# Patient Record
Sex: Female | Born: 2000 | Hispanic: Yes | Marital: Single | Smoking: Never smoker
Health system: Southern US, Community
[De-identification: ages and names within clinical notes are randomized; demographics above are authoritative.]

## PROBLEM LIST (undated history)

## (undated) DIAGNOSIS — O00101 Right tubal pregnancy without intrauterine pregnancy: Secondary | ICD-10-CM

## (undated) DIAGNOSIS — Z789 Other specified health status: Secondary | ICD-10-CM

## (undated) HISTORY — DX: Right tubal pregnancy without intrauterine pregnancy: O00.101

## (undated) HISTORY — PX: NO PAST SURGERIES: SHX2092

---

## 2020-07-07 ENCOUNTER — Encounter (HOSPITAL_COMMUNITY): Payer: Self-pay

## 2020-07-07 ENCOUNTER — Other Ambulatory Visit: Payer: Self-pay

## 2020-07-07 ENCOUNTER — Ambulatory Visit (HOSPITAL_COMMUNITY)
Admission: EM | Admit: 2020-07-07 | Discharge: 2020-07-07 | Disposition: A | Discharge status: Home / Self Care | Payer: 59 | Attending: Family Medicine | Admitting: Family Medicine

## 2020-07-07 DIAGNOSIS — N3091 Cystitis, unspecified with hematuria: Secondary | ICD-10-CM | POA: Diagnosis present

## 2020-07-07 DIAGNOSIS — R300 Dysuria: Secondary | ICD-10-CM | POA: Diagnosis present

## 2020-07-07 DIAGNOSIS — R1084 Generalized abdominal pain: Secondary | ICD-10-CM | POA: Diagnosis present

## 2020-07-07 DIAGNOSIS — Z3202 Encounter for pregnancy test, result negative: Secondary | ICD-10-CM | POA: Diagnosis not present

## 2020-07-07 LAB — POCT URINALYSIS DIP (DEVICE)
Bilirubin Urine: NEGATIVE
Glucose, UA: NEGATIVE mg/dL
Ketones, ur: NEGATIVE mg/dL
Nitrite: POSITIVE — AB
Protein, ur: NEGATIVE mg/dL
Specific Gravity, Urine: 1.025 (ref 1.005–1.030)
Urobilinogen, UA: 0.2 mg/dL (ref 0.0–1.0)
pH: 6.5 (ref 5.0–8.0)

## 2020-07-07 LAB — POC URINE PREG, ED: Preg Test, Ur: NEGATIVE

## 2020-07-07 MED ORDER — SULFAMETHOXAZOLE-TRIMETHOPRIM 800-160 MG PO TABS
1.0000 | ORAL_TABLET | Freq: Two times a day (BID) | ORAL | 0 refills | Status: DC
Start: 2020-07-07 — End: 2021-02-05

## 2020-07-07 NOTE — ED Triage Notes (Signed)
Stratus video translator used ID [id number].    Pt reports abdominal pain, vaginal pain, and light yellow urine that has a bad odor starting 3 days ago and dysuria.  Pt taking medicine that the pharmacy gave her, unsure of name.  Pt denies vaginal discharge, itching or rash.  Pt reports hematuria when symptoms started.  Pt also reports unprotected sex and requests STD testing.

## 2020-07-07 NOTE — ED Provider Notes (Addendum)
[facility]   MRN: [id number] DOB: May 21, 2001  Subjective:   Meredith Sullivan is a 19 y.o. female presenting for 3-day history of persistent worsening dysuria, urinary frequency, abdominal pain, low back pain, dark-colored urine that is malodorous.  Patient states that she took over-the-counter medication as recommended by the pharmacy with no improvement.  She has also had unprotected sex and would like STI testing.  Denies vaginal discharge, genital rash.  She is not currently taking any medications and has no known food or drug allergies.  Denies past medical and surgical history.   Family History  Problem Relation Age of Onset   Healthy Mother    Healthy Father     Social History   Tobacco Use   Smoking status: Never Smoker   Smokeless tobacco: Never Used  Building services engineer Use: Some days   Substances: Flavoring  Substance Use Topics   Alcohol use: Never   Drug use: Not Currently    ROS   Objective:   Vitals: BP 105/65 (BP Location: Left Arm)    Pulse 68    Temp 98.9 F (37.2 C) (Oral)    Resp 18    LMP 06/23/2020 (Exact Date)    SpO2 100%   Physical Exam Constitutional:      General: She is not in acute distress.    Appearance: Normal appearance. She is well-developed. She is not ill-appearing, toxic-appearing or diaphoretic.  HENT:     Head: Normocephalic and atraumatic.     Nose: Nose normal.     Mouth/Throat:     Mouth: Mucous membranes are moist.     Pharynx: Oropharynx is clear.  Eyes:     General: No scleral icterus.    Extraocular Movements: Extraocular movements intact.     Pupils: Pupils are equal, round, and reactive to light.  Cardiovascular:     Rate and Rhythm: Normal rate.  Pulmonary:     Effort: Pulmonary effort is normal.  Abdominal:     General: Bowel sounds are normal. There is no distension.     Palpations: Abdomen is soft. There is no mass.     Tenderness: There is abdominal tenderness in the right upper  quadrant, right lower quadrant, suprapubic area and left lower quadrant. There is no right CVA tenderness, left CVA tenderness, guarding or rebound.  Skin:    General: Skin is warm and dry.  Neurological:     General: No focal deficit present.     Mental Status: She is alert and oriented to person, place, and time.  Psychiatric:        Mood and Affect: Mood normal.        Behavior: Behavior normal.        Thought Content: Thought content normal.        Judgment: Judgment normal.     Results for orders placed or performed during the hospital encounter of 07/07/20 (from the past 24 hour(s))  POCT urinalysis dip (device)     Status: Abnormal   Collection Time: 07/07/20  2:38 PM  Result Value Ref Range   Glucose, UA NEGATIVE NEGATIVE mg/dL   Bilirubin Urine NEGATIVE NEGATIVE   Ketones, ur NEGATIVE NEGATIVE mg/dL   Specific Gravity, Urine 1.025 1.005 - 1.030   Hgb urine dipstick SMALL (A) NEGATIVE   pH 6.5 5.0 - 8.0   Protein, ur NEGATIVE NEGATIVE mg/dL   Urobilinogen, UA 0.2 0.0 - 1.0 mg/dL   Nitrite POSITIVE (A) NEGATIVE   Leukocytes,Ua SMALL (  A) NEGATIVE  POC urine pregnancy     Status: None   Collection Time: 07/07/20  2:45 PM  Result Value Ref Range   Preg Test, Ur NEGATIVE NEGATIVE    Assessment and Plan :   PDMP not reviewed this encounter.  1. Generalized abdominal pain   2. Dysuria   3. Hemorrhagic cystitis     Negative pregnancy test, low suspicion for pyelonephritis.  Patient is to start Bactrim to cover for hemorrhagic cystitis, hydrate aggressively.  STI testing, urine culture pending. Counseled patient on potential for adverse effects with medications prescribed/recommended today, ER and return-to-clinic precautions discussed, patient verbalized understanding.    Wallis Bamberg, PA-C 07/07/20 1505

## 2020-07-08 LAB — CERVICOVAGINAL ANCILLARY ONLY
Bacterial Vaginitis (gardnerella): NEGATIVE
Candida Glabrata: NEGATIVE
Candida Vaginitis: NEGATIVE
Chlamydia: NEGATIVE
Comment: NEGATIVE
Comment: NEGATIVE
Comment: NEGATIVE
Comment: NEGATIVE
Comment: NEGATIVE
Comment: NORMAL
Neisseria Gonorrhea: NEGATIVE
Trichomonas: NEGATIVE

## 2020-07-09 LAB — URINE CULTURE: Culture: 20000 — AB

## 2021-02-05 ENCOUNTER — Other Ambulatory Visit: Payer: Self-pay

## 2021-02-05 ENCOUNTER — Emergency Department (HOSPITAL_COMMUNITY): Payer: Self-pay

## 2021-02-05 ENCOUNTER — Emergency Department (HOSPITAL_COMMUNITY)
Admission: EM | Admit: 2021-02-05 | Discharge: 2021-02-05 | Disposition: A | Discharge status: Home / Self Care | Payer: Self-pay | Attending: Emergency Medicine | Admitting: Emergency Medicine

## 2021-02-05 ENCOUNTER — Encounter (HOSPITAL_COMMUNITY): Payer: Self-pay | Admitting: Emergency Medicine

## 2021-02-05 DIAGNOSIS — M25551 Pain in right hip: Secondary | ICD-10-CM | POA: Insufficient documentation

## 2021-02-05 DIAGNOSIS — M25559 Pain in unspecified hip: Secondary | ICD-10-CM

## 2021-02-05 DIAGNOSIS — R519 Headache, unspecified: Secondary | ICD-10-CM | POA: Insufficient documentation

## 2021-02-05 DIAGNOSIS — H538 Other visual disturbances: Secondary | ICD-10-CM | POA: Insufficient documentation

## 2021-02-05 DIAGNOSIS — M25552 Pain in left hip: Secondary | ICD-10-CM | POA: Insufficient documentation

## 2021-02-05 LAB — BASIC METABOLIC PANEL
Anion gap: 9 (ref 5–15)
BUN: 5 mg/dL — ABNORMAL LOW (ref 6–20)
CO2: 22 mmol/L (ref 22–32)
Calcium: 9.3 mg/dL (ref 8.9–10.3)
Chloride: 108 mmol/L (ref 98–111)
Creatinine, Ser: 0.56 mg/dL (ref 0.44–1.00)
GFR, Estimated: >60 mL/min (ref >60)
Glucose, Bld: 91 mg/dL (ref 70–99)
Potassium: 3.6 mmol/L (ref 3.5–5.1)
Sodium: 139 mmol/L (ref 135–145)

## 2021-02-05 LAB — CBC WITH DIFFERENTIAL/PLATELET
Abs Immature Granulocytes: 0.01 10*3/uL (ref 0.00–0.07)
Basophils Absolute: 0 10*3/uL (ref 0.0–0.1)
Basophils Relative: 0 %
Eosinophils Absolute: 0.2 10*3/uL (ref 0.0–0.5)
Eosinophils Relative: 3 %
HCT: 42.4 % (ref 36.0–46.0)
Hemoglobin: 13.7 g/dL (ref 12.0–15.0)
Immature Granulocytes: 0 %
Lymphocytes Relative: 48 %
Lymphs Abs: 2.9 10*3/uL (ref 0.7–4.0)
MCH: 29.7 pg (ref 26.0–34.0)
MCHC: 32.3 g/dL (ref 30.0–36.0)
MCV: 91.8 fL (ref 80.0–100.0)
Monocytes Absolute: 0.6 10*3/uL (ref 0.1–1.0)
Monocytes Relative: 10 %
Neutro Abs: 2.4 10*3/uL (ref 1.7–7.7)
Neutrophils Relative %: 39 %
Platelets: 216 10*3/uL (ref 150–400)
RBC: 4.62 MIL/uL (ref 3.87–5.11)
RDW: 14.6 % (ref 11.5–15.5)
WBC: 6.2 10*3/uL (ref 4.0–10.5)
nRBC: 0 % (ref 0.0–0.2)

## 2021-02-05 LAB — URINALYSIS, ROUTINE W REFLEX MICROSCOPIC
Bilirubin Urine: NEGATIVE
Glucose, UA: NEGATIVE mg/dL
Hgb urine dipstick: NEGATIVE
Ketones, ur: NEGATIVE mg/dL
Leukocytes,Ua: NEGATIVE
Nitrite: NEGATIVE
Protein, ur: NEGATIVE mg/dL
Specific Gravity, Urine: 1.003 — ABNORMAL LOW (ref 1.005–1.030)
pH: 8 (ref 5.0–8.0)

## 2021-02-05 LAB — POC URINE PREG, ED: Preg Test, Ur: NEGATIVE

## 2021-02-05 MED ORDER — SODIUM CHLORIDE 0.9 % IV BOLUS
1000.0000 mL | Freq: Once | INTRAVENOUS | Status: AC
  Administered 2021-02-05: 1000 mL via INTRAVENOUS

## 2021-02-05 NOTE — ED Provider Notes (Signed)
[facility] EMERGENCY DEPARTMENT Provider Note   CSN: [id number] Arrival date & time: 02/05/21  1417     History Chief Complaint  Patient presents with  . Blurred Vision  . Back Pain    Meredith Sullivan is a 20 y.o. female no significant history who presents to the ED for multiple complaints. Over the last month since hitting her head, she reports intermittent headache and blurry vision. Initially began having blurry vision over 1 year ago at which time she was seen by Ophtho and prescribed corrective lenses which helped. Since hitting her head, she reports intermittent episodes of blurry vision that come on spontaneously without known precipitating events and then resolved on its own. Also reports intermittent pressure-like generalized headaches. No thunderclap component, no headache upon awakening in the AM. Patient states she notices them usually during the week days and states being under considerable stress recently. No previous history of headaches. In additional, patient also reports chronic BLT hip pain over the last 8 months which was worse today. No previous injury to hips. Denies fever, chills, neck pain, hearing loss, tinnitus, chest pain, SOB, N/V/D, photophobia, abdominal pain, or urinary symptoms. LMP 2 months ago; states to have irregular periods at baseline. In ED, patient denies headache or vision changes.  The history is provided by the patient and medical records. The history is limited by a language barrier. A language interpreter was used.  Headache Pain location:  Generalized Quality: pressure. Radiates to:  Does not radiate Onset quality:  Gradual Duration:  4 weeks Timing:  Intermittent Progression:  Resolved Chronicity:  New Similar to prior headaches: no   Context comment:  Struck her head 4 weeks ago Relieved by:  Nothing Worsened by:  Nothing Ineffective treatments:  None tried Associated symptoms: blurred vision   Associated  symptoms: no abdominal pain, no back pain, no cough, no dizziness, no ear pain, no eye pain, no fever, no focal weakness, no hearing loss, no loss of balance, no nausea, no near-syncope, no neck pain, no neck stiffness, no numbness, no seizures, no sore throat, no syncope and no vomiting        History reviewed. No pertinent past medical history.  There are no problems to display for this patient.   History reviewed. No pertinent surgical history.   OB History   No obstetric history on file.     Family History  Problem Relation Age of Onset  . Healthy Mother   . Healthy Father     Social History   Tobacco Use  . Smoking status: Never Smoker  . Smokeless tobacco: Never Used  Vaping Use  . Vaping Use: Some days  . Substances: Flavoring  Substance Use Topics  . Alcohol use: Never  . Drug use: Not Currently    Home Medications Prior to Admission medications   Medication Sig Start Date End Date Taking? Authorizing Provider  acetaminophen (TYLENOL) 500 MG tablet Take 1,000 mg by mouth 2 (two) times daily as needed for headache (pain).   Yes [provider]  Ascorbic Acid (VITAMIN C PO) Take 1 tablet by mouth 2 (two) times daily.   Yes [provider]  levonorgestrel (PLAN B 1-STEP) 1.5 MG tablet Take 1.5 mg by mouth once as needed (following sexual intercourse).   Yes [provider]    Allergies    Patient has no known allergies.  Review of Systems   Review of Systems  Constitutional: Negative for chills and fever.  HENT: Negative  for ear pain, hearing loss and sore throat.   Eyes: Positive for blurred vision and visual disturbance. Negative for pain.  Respiratory: Negative for cough and shortness of breath.   Cardiovascular: Negative for chest pain, palpitations, syncope and near-syncope.  Gastrointestinal: Negative for abdominal pain, nausea and vomiting.  Genitourinary: Negative for dysuria and hematuria.  Musculoskeletal: Negative for  arthralgias, back pain, neck pain and neck stiffness.  Skin: Negative for color change and rash.  Neurological: Positive for headaches. Negative for dizziness, focal weakness, seizures, syncope, numbness and loss of balance.  All other systems reviewed and are negative.   Physical Exam Updated Vital Signs BP 107/67   Pulse 70   Temp 98.4 F (36.9 C) (Oral)   Resp (!) 21   SpO2 100%   Physical Exam Vitals and nursing note reviewed.  Constitutional:      General: She is awake. She is not in acute distress.    Appearance: Normal appearance. She is well-developed, well-groomed, overweight and well-nourished. She is not ill-appearing.  HENT:     Head: Normocephalic and atraumatic.     Right Ear: External ear normal.     Left Ear: External ear normal.     Nose: Nose normal.     Mouth/Throat:     Mouth: Mucous membranes are moist.     Pharynx: Oropharynx is clear. No oropharyngeal exudate or posterior oropharyngeal erythema.  Eyes:     General: No visual field deficit or scleral icterus.       Right eye: No discharge.        Left eye: No discharge.     Extraocular Movements: Extraocular movements intact.     Conjunctiva/sclera: Conjunctivae normal.     Pupils: Pupils are equal, round, and reactive to light.  Cardiovascular:     Rate and Rhythm: Normal rate and regular rhythm.     Pulses: Normal pulses.     Heart sounds: Normal heart sounds. No murmur heard.   Pulmonary:     Effort: Pulmonary effort is normal. No respiratory distress.     Breath sounds: Normal breath sounds.  Abdominal:     General: Abdomen is flat. There is no distension.     Palpations: Abdomen is soft.     Tenderness: There is no abdominal tenderness. There is no guarding or rebound.  Musculoskeletal:        General: No edema.     Cervical back: Neck supple. No pain with movement, spinous process tenderness or muscular tenderness. Normal range of motion.     Comments: No tenderness or overlying skin  changes to BLT hips. ROM preserved and NVI distally in BLE.  Skin:    General: Skin is warm and dry.     Findings: No rash.  Neurological:     General: No focal deficit present.     Mental Status: She is alert and oriented to person, place, and time.     GCS: GCS eye subscore is 4. GCS verbal subscore is 5. GCS motor subscore is 6.     Cranial Nerves: Cranial nerves are intact. No cranial nerve deficit, dysarthria or facial asymmetry.     Sensory: Sensation is intact. No sensory deficit.     Motor: Motor function is intact. No weakness or pronator drift.     Coordination: Coordination is intact. Finger-Nose-Finger Test and Heel to [place] Test normal.     Gait: Gait is intact.  Psychiatric:        Mood and Affect: Mood  and affect and mood normal.        Behavior: Behavior normal. Behavior is cooperative.     ED Results / Procedures / Treatments   Labs (all labs ordered are listed, but only abnormal results are displayed) Labs Reviewed  BASIC METABOLIC PANEL - Abnormal; Notable for the following components:      Result Value   BUN 5 (*)    All other components within normal limits  CBC WITH DIFFERENTIAL/PLATELET  URINALYSIS, ROUTINE W REFLEX MICROSCOPIC  POC URINE PREG, ED    EKG None  Radiology CT Head Wo Contrast  Result Date: 02/05/2021 CLINICAL DATA:  Headache.  Pt reports lower back pain x 8 months. EXAM: CT HEAD WITHOUT CONTRAST TECHNIQUE: Contiguous axial images were obtained from the base of the skull through the vertex without intravenous contrast. COMPARISON:  None. FINDINGS: Brain: No evidence of large-territorial acute infarction. No parenchymal hemorrhage. No mass lesion. No extra-axial collection. No mass effect or midline shift. No hydrocephalus. Basilar cisterns are patent. Vascular: No hyperdense vessel. Skull: No acute fracture or focal lesion. Sinuses/Orbits: Paranasal sinuses and mastoid air cells are clear. The orbits are unremarkable. Other: None. IMPRESSION:  No acute intracranial abnormality. Electronically Signed   By: Tish FredericksonMorgane  Naveau M.D.   On: 02/05/2021 17:14   DG Pelvis Portable  Result Date: 02/05/2021 CLINICAL DATA:  Bilateral hip pain. EXAM: PORTABLE PELVIS 1-2 VIEWS COMPARISON:  None. FINDINGS: There is no evidence of pelvic fracture or diastasis. No pelvic bone lesions are seen. IMPRESSION: Negative. Electronically Signed   By: Tish FredericksonMorgane  Naveau M.D.   On: 02/05/2021 16:21    Procedures Procedures  Medications Ordered in ED Medications  sodium chloride 0.9 % bolus 1,000 mL (1,000 mLs Intravenous New Bag/Given 02/05/21 1549)    ED Course  I have reviewed the triage vital signs and the nursing notes.  Pertinent labs & imaging results that were available during my care of the patient were reviewed by me and considered in my medical decision making (see chart for details).    MDM Rules/Calculators/A&P                          Patient is a well-appearing 19yoF with history and physical as described above who presents to the ED for intermittent headache, blurry vision, and BLT hip pain. VS reassuring and HDS. Patient resting comfortably and no acute distress. Not experiencing blurry vision or headache at this time, but still having hip pain. Initial workup includes head CT, hip plain films, and blood work.  CT head and hip plain films unremarkable. Preg negative. Blood work reassuring. On reassessment, patient resting comfortably. No headache or blurred vision during ED course. Doubt ICH, space occupying lesion, CVA/TIA, hydrocephalus, carotid dissection, meningitis, preeclampsia, cerebral sinus thrombosis, vertebral artery insufficiency, or other acute emergent pathology at this time. Etiology for headache and blurry vision remain unclear at this time but could be related to atypical migraine, tension HA, or idiopathic intracranial hypertension. Recommend she follow up with her Ophthalmologist if she continues to have blurry vision. Regarding  hip pain, doubt septic arthritis, fracture, dislocation, avascular necrosis, or other acute pathology at this time. Suspect presentation likely musculoskeletal. Recommend Tylenol and NSAIDs for pain. Will provide number for her to follow up with new PCP if she continues to have symptoms. Patient otherwise HDS and appropriate for discharge.  Strict return precautions provided and discussed. Questions and concerns addressed. Patient verbalized understanding and amenable with discharge plan.  Discharged in stable condition.   Final Clinical Impression(s) / ED Diagnoses Final diagnoses:  Hip pain  Nonintractable headache, unspecified chronicity pattern, unspecified headache type  Blurry vision    Rx / DC Orders ED Discharge Orders    None       Tonia Brooms, MD 02/06/21 9191    Jacalyn Lefevre, MD 02/06/21 [phone]

## 2021-02-05 NOTE — ED Triage Notes (Signed)
Pt reports lower back pain x 8 months.  Denies urinary complaints.  Reports blurred vision x 5 months with headaches.  States she had blurred vision 1 year ago and got glasses and it was better for awhile and then returned.  LMP 2 months ago- states she is sexually active but takes Plan B after having intercourse so she doesn't believe she is pregnant.  Str[employer] interpreter used.

## 2022-03-20 ENCOUNTER — Ambulatory Visit (HOSPITAL_COMMUNITY)
Admission: AD | Admit: 2022-03-20 | Discharge: 2022-03-20 | Disposition: A | Discharge status: Home / Self Care | Payer: Self-pay | Attending: Obstetrics & Gynecology | Admitting: Obstetrics & Gynecology

## 2022-03-20 ENCOUNTER — Inpatient Hospital Stay (HOSPITAL_BASED_OUTPATIENT_CLINIC_OR_DEPARTMENT_OTHER): Payer: Self-pay | Admitting: Certified Registered"

## 2022-03-20 ENCOUNTER — Inpatient Hospital Stay (HOSPITAL_COMMUNITY): Payer: Self-pay | Admitting: Certified Registered"

## 2022-03-20 ENCOUNTER — Encounter (HOSPITAL_COMMUNITY): Payer: Self-pay

## 2022-03-20 ENCOUNTER — Encounter (HOSPITAL_COMMUNITY): Payer: Self-pay | Admitting: Obstetrics & Gynecology

## 2022-03-20 ENCOUNTER — Encounter (HOSPITAL_COMMUNITY)
Admission: AD | Disposition: A | Discharge status: Home / Self Care | Payer: Self-pay | Source: Home / Self Care | Attending: Obstetrics & Gynecology

## 2022-03-20 ENCOUNTER — Inpatient Hospital Stay (HOSPITAL_COMMUNITY): Payer: Self-pay

## 2022-03-20 ENCOUNTER — Other Ambulatory Visit: Payer: Self-pay

## 2022-03-20 ENCOUNTER — Ambulatory Visit (HOSPITAL_COMMUNITY)
Admission: EM | Admit: 2022-03-20 | Discharge: 2022-03-20 | Disposition: A | Discharge status: Home / Self Care | Payer: Self-pay | Attending: Physician Assistant | Admitting: Physician Assistant

## 2022-03-20 DIAGNOSIS — O00109 Unspecified tubal pregnancy without intrauterine pregnancy: Secondary | ICD-10-CM

## 2022-03-20 DIAGNOSIS — Z3A01 Less than 8 weeks gestation of pregnancy: Secondary | ICD-10-CM

## 2022-03-20 DIAGNOSIS — Z3A00 Weeks of gestation of pregnancy not specified: Secondary | ICD-10-CM

## 2022-03-20 DIAGNOSIS — O00101 Right tubal pregnancy without intrauterine pregnancy: Secondary | ICD-10-CM

## 2022-03-20 DIAGNOSIS — O4690 Antepartum hemorrhage, unspecified, unspecified trimester: Secondary | ICD-10-CM

## 2022-03-20 DIAGNOSIS — Z3201 Encounter for pregnancy test, result positive: Secondary | ICD-10-CM

## 2022-03-20 HISTORY — DX: Other specified health status: Z78.9

## 2022-03-20 HISTORY — PX: LAPAROSCOPIC UNILATERAL SALPINGECTOMY: SHX5934

## 2022-03-20 LAB — CBC
HCT: 39.6 % (ref 36.0–46.0)
Hemoglobin: 13.6 g/dL (ref 12.0–15.0)
MCH: 30.4 pg (ref 26.0–34.0)
MCHC: 34.3 g/dL (ref 30.0–36.0)
MCV: 88.6 fL (ref 80.0–100.0)
Platelets: 231 10*3/uL (ref 150–400)
RBC: 4.47 MIL/uL (ref 3.87–5.11)
RDW: 14.5 % (ref 11.5–15.5)
WBC: 10.6 10*3/uL — ABNORMAL HIGH (ref 4.0–10.5)
nRBC: 0 % (ref 0.0–0.2)

## 2022-03-20 LAB — WET PREP, GENITAL
Clue Cells Wet Prep HPF POC: NONE SEEN
Sperm: NONE SEEN
Trich, Wet Prep: NONE SEEN
WBC, Wet Prep HPF POC: >=10 — AB (ref <10)
Yeast Wet Prep HPF POC: NONE SEEN

## 2022-03-20 LAB — COMPREHENSIVE METABOLIC PANEL
ALT: 22 U/L (ref 0–44)
AST: 19 U/L (ref 15–41)
Albumin: 4.3 g/dL (ref 3.5–5.0)
Alkaline Phosphatase: 58 U/L (ref 38–126)
Anion gap: 7 (ref 5–15)
BUN: 8 mg/dL (ref 6–20)
CO2: 24 mmol/L (ref 22–32)
Calcium: 9.3 mg/dL (ref 8.9–10.3)
Chloride: 108 mmol/L (ref 98–111)
Creatinine, Ser: 0.66 mg/dL (ref 0.44–1.00)
GFR, Estimated: >60 mL/min (ref >60)
Glucose, Bld: 102 mg/dL — ABNORMAL HIGH (ref 70–99)
Potassium: 3.3 mmol/L — ABNORMAL LOW (ref 3.5–5.1)
Sodium: 139 mmol/L (ref 135–145)
Total Bilirubin: 0.6 mg/dL (ref 0.3–1.2)
Total Protein: 7.2 g/dL (ref 6.5–8.1)

## 2022-03-20 LAB — ABO/RH: ABO/RH(D): O POS

## 2022-03-20 LAB — POC URINE PREG, ED: Preg Test, Ur: POSITIVE — AB

## 2022-03-20 LAB — HCG, QUANTITATIVE, PREGNANCY: hCG, Beta Chain, Quant, S: 6452 m[IU]/mL — ABNORMAL HIGH (ref <5)

## 2022-03-20 SURGERY — SALPINGECTOMY, UNILATERAL, LAPAROSCOPIC
Anesthesia: General | Laterality: Right

## 2022-03-20 MED ORDER — BUPIVACAINE HCL (PF) 0.25 % IJ SOLN
INTRAMUSCULAR | Status: AC
Start: 2022-03-20 — End: ?
  Filled 2022-03-20: qty 30

## 2022-03-20 MED ORDER — POVIDONE-IODINE 10 % EX SWAB
2.0000 "application " | Freq: Once | CUTANEOUS | Status: AC
  Administered 2022-03-20: 2 via TOPICAL

## 2022-03-20 MED ORDER — HYDROMORPHONE HCL 1 MG/ML IJ SOLN: 0.2500 mg | INTRAMUSCULAR | Status: DC | PRN

## 2022-03-20 MED ORDER — ARTIFICIAL TEARS OPHTHALMIC OINT
TOPICAL_OINTMENT | OPHTHALMIC | Status: AC
  Filled 2022-03-20: qty 3.5

## 2022-03-20 MED ORDER — MIDAZOLAM HCL 2 MG/2ML IJ SOLN
INTRAMUSCULAR | Status: AC
Start: 2022-03-20 — End: ?
  Filled 2022-03-20: qty 2

## 2022-03-20 MED ORDER — DEXAMETHASONE SODIUM PHOSPHATE 10 MG/ML IJ SOLN
INTRAMUSCULAR | Status: AC
  Filled 2022-03-20: qty 1

## 2022-03-20 MED ORDER — SUGAMMADEX SODIUM 200 MG/2ML IV SOLN
INTRAVENOUS | Status: DC | PRN
  Administered 2022-03-20: 300 mg via INTRAVENOUS

## 2022-03-20 MED ORDER — SODIUM CHLORIDE 0.9 % IR SOLN
Status: DC | PRN
  Administered 2022-03-20: 1000 mL

## 2022-03-20 MED ORDER — ROCURONIUM BROMIDE 10 MG/ML (PF) SYRINGE
PREFILLED_SYRINGE | INTRAVENOUS | Status: AC
  Filled 2022-03-20: qty 10

## 2022-03-20 MED ORDER — CHLORHEXIDINE GLUCONATE 0.12 % MT SOLN
OROMUCOSAL | Status: AC
  Administered 2022-03-20: 15 mL via OROMUCOSAL
  Filled 2022-03-20: qty 15

## 2022-03-20 MED ORDER — LIDOCAINE 2% (20 MG/ML) 5 ML SYRINGE
INTRAMUSCULAR | Status: AC
  Filled 2022-03-20: qty 5

## 2022-03-20 MED ORDER — SUCCINYLCHOLINE CHLORIDE 200 MG/10ML IV SOSY
PREFILLED_SYRINGE | INTRAVENOUS | Status: DC | PRN
  Administered 2022-03-20: 120 mg via INTRAVENOUS

## 2022-03-20 MED ORDER — KETOROLAC TROMETHAMINE 30 MG/ML IJ SOLN
INTRAMUSCULAR | Status: AC
  Filled 2022-03-20: qty 1

## 2022-03-20 MED ORDER — IBUPROFEN 600 MG PO TABS
600.0000 mg | ORAL_TABLET | Freq: Four times a day (QID) | ORAL | 3 refills | Status: DC | PRN
Start: 2022-03-20 — End: 2022-06-28

## 2022-03-20 MED ORDER — ROCURONIUM BROMIDE 10 MG/ML (PF) SYRINGE
PREFILLED_SYRINGE | INTRAVENOUS | Status: DC | PRN
Start: 2022-03-20 — End: 2022-03-20
  Administered 2022-03-20: 40 mg via INTRAVENOUS
  Administered 2022-03-20: 20 mg via INTRAVENOUS

## 2022-03-20 MED ORDER — BUPIVACAINE HCL (PF) 0.25 % IJ SOLN
INTRAMUSCULAR | Status: DC | PRN
  Administered 2022-03-20: 20 mL

## 2022-03-20 MED ORDER — ORAL CARE MOUTH RINSE: 15.0000 mL | Freq: Once | OROMUCOSAL | Status: AC

## 2022-03-20 MED ORDER — CHLORHEXIDINE GLUCONATE 0.12 % MT SOLN: 15.0000 mL | Freq: Once | OROMUCOSAL | Status: AC

## 2022-03-20 MED ORDER — ONDANSETRON HCL 4 MG/2ML IJ SOLN
INTRAMUSCULAR | Status: DC | PRN
  Administered 2022-03-20: 4 mg via INTRAVENOUS

## 2022-03-20 MED ORDER — LIDOCAINE 2% (20 MG/ML) 5 ML SYRINGE
INTRAMUSCULAR | Status: DC | PRN
  Administered 2022-03-20: 100 mg via INTRAVENOUS

## 2022-03-20 MED ORDER — LACTATED RINGERS IV SOLN: INTRAVENOUS | Status: DC

## 2022-03-20 MED ORDER — PROPOFOL 10 MG/ML IV BOLUS
INTRAVENOUS | Status: AC
  Filled 2022-03-20: qty 20

## 2022-03-20 MED ORDER — SUCCINYLCHOLINE CHLORIDE 200 MG/10ML IV SOSY
PREFILLED_SYRINGE | INTRAVENOUS | Status: AC
  Filled 2022-03-20: qty 10

## 2022-03-20 MED ORDER — MIDAZOLAM HCL 2 MG/2ML IJ SOLN
INTRAMUSCULAR | Status: DC | PRN
  Administered 2022-03-20: 2 mg via INTRAVENOUS

## 2022-03-20 MED ORDER — DEXAMETHASONE SODIUM PHOSPHATE 10 MG/ML IJ SOLN
INTRAMUSCULAR | Status: DC | PRN
  Administered 2022-03-20: 10 mg via INTRAVENOUS

## 2022-03-20 MED ORDER — KETOROLAC TROMETHAMINE 30 MG/ML IJ SOLN
INTRAMUSCULAR | Status: DC | PRN
  Administered 2022-03-20: 30 mg via INTRAVENOUS

## 2022-03-20 MED ORDER — ONDANSETRON HCL 4 MG/2ML IJ SOLN
INTRAMUSCULAR | Status: AC
  Filled 2022-03-20: qty 2

## 2022-03-20 MED ORDER — PROPOFOL 10 MG/ML IV BOLUS
INTRAVENOUS | Status: DC | PRN
  Administered 2022-03-20: 160 mg via INTRAVENOUS

## 2022-03-20 MED ORDER — OXYCODONE-ACETAMINOPHEN 5-325 MG PO TABS: 1.0000 | ORAL_TABLET | Freq: Four times a day (QID) | ORAL | 0 refills | Status: DC | PRN

## 2022-03-20 MED ORDER — FENTANYL CITRATE (PF) 250 MCG/5ML IJ SOLN
INTRAMUSCULAR | Status: DC | PRN
Start: 2022-03-20 — End: 2022-03-20
  Administered 2022-03-20: 50 ug via INTRAVENOUS
  Administered 2022-03-20: 100 ug via INTRAVENOUS
  Administered 2022-03-20 (×2): 50 ug via INTRAVENOUS

## 2022-03-20 MED ORDER — HYDROMORPHONE HCL 1 MG/ML IJ SOLN
0.5000 mg | Freq: Once | INTRAMUSCULAR | Status: AC
  Administered 2022-03-20: 0.5 mg via INTRAMUSCULAR
  Filled 2022-03-20: qty 1

## 2022-03-20 MED ORDER — FENTANYL CITRATE (PF) 250 MCG/5ML IJ SOLN
INTRAMUSCULAR | Status: AC
  Filled 2022-03-20: qty 5

## 2022-03-20 SURGICAL SUPPLY — 29 items
DERMABOND ADVANCED (GAUZE/BANDAGES/DRESSINGS) ×1
DERMABOND ADVANCED .7 DNX12 (GAUZE/BANDAGES/DRESSINGS) ×1 IMPLANT
DRSG OPSITE POSTOP 3X4 (GAUZE/BANDAGES/DRESSINGS) ×1 IMPLANT
DURAPREP 26ML APPLICATOR (WOUND CARE) ×2 IMPLANT
ELECT REM PT RETURN 9FT ADLT (ELECTROSURGICAL)
ELECTRODE REM PT RTRN 9FT ADLT (ELECTROSURGICAL) IMPLANT
GLOVE SURG POLYISO LF SZ6.5 (GLOVE) ×2 IMPLANT
GLOVE SURG UNDER POLY LF SZ6.5 (GLOVE) ×4 IMPLANT
GLOVE SURG UNDER POLY LF SZ7 (GLOVE) ×4 IMPLANT
GOWN STRL REUS W/ TWL LRG LVL3 (GOWN DISPOSABLE) ×2 IMPLANT
GOWN STRL REUS W/TWL LRG LVL3 (GOWN DISPOSABLE) ×2
IRRIG SUCT STRYKERFLOW 2 WTIP (MISCELLANEOUS) ×2
IRRIGATION SUCT STRKRFLW 2 WTP (MISCELLANEOUS) IMPLANT
KIT TURNOVER KIT B (KITS) ×2 IMPLANT
NS IRRIG 1000ML POUR BTL (IV SOLUTION) ×2 IMPLANT
PACK LAPAROSCOPY BASIN (CUSTOM PROCEDURE TRAY) ×2 IMPLANT
PACK TRENDGUARD 450 HYBRID PRO (MISCELLANEOUS) IMPLANT
POUCH SPECIMEN RETRIEVAL 10MM (ENDOMECHANICALS) ×1 IMPLANT
PROTECTOR NERVE ULNAR (MISCELLANEOUS) ×4 IMPLANT
SET TUBE SMOKE EVAC HIGH FLOW (TUBING) ×2 IMPLANT
SHEARS HARMONIC ACE PLUS 36CM (ENDOMECHANICALS) ×1 IMPLANT
SLEEVE ENDOPATH XCEL 5M (ENDOMECHANICALS) ×2 IMPLANT
SUT MNCRL AB 4-0 PS2 18 (SUTURE) ×2 IMPLANT
SUT VICRYL 0 UR6 27IN ABS (SUTURE) ×4 IMPLANT
TOWEL GREEN STERILE FF (TOWEL DISPOSABLE) ×4 IMPLANT
TRAY FOLEY W/BAG SLVR 14FR (SET/KITS/TRAYS/PACK) ×2 IMPLANT
TRENDGUARD 450 HYBRID PRO PACK (MISCELLANEOUS) ×2
TROCAR BALLN 12MMX100 BLUNT (TROCAR) ×2 IMPLANT
TROCAR XCEL NON-BLD 5MMX100MML (ENDOMECHANICALS) ×2 IMPLANT

## 2022-03-20 NOTE — MAU Note (Addendum)
...  Meredith Sullivan is a 21 y.o. at Unknown here in MAU reporting: Vaginal bleeding with occasional blood clots smaller than a quarter since the beginning of April. She states the bleeding stopped this past Thursday on 3/6 but states it returned this morning and is "very light." She states she took Plan B sometime in March. She states she is unsure of when her last period was. She is also endorsing intermittent mid abdominal pain that began today at 1000 that she rates 8/10.  ? ?Pain score:  ?8/10 mid abdomen ? ?Lab orders placed from triage: UA ? ?

## 2022-03-20 NOTE — Anesthesia Postprocedure Evaluation (Signed)
Anesthesia Post Note ? ?Patient: Meredith Sullivan ? ?Procedure(s) Performed: LAPAROSCOPIC RIGHT SALPINGECTOMY WITH REMOVAL OF ECTOPIC PREGNANCY (Right) ? ?  ? ?Patient location during evaluation: PACU ?Anesthesia Type: General ?Level of consciousness: awake ?Pain management: pain level controlled ?Respiratory status: spontaneous breathing ?Cardiovascular status: stable ?Postop Assessment: no apparent nausea or vomiting ?Anesthetic complications: no ? ? ?No notable events documented. ? ?Last Vitals:  ?Vitals:  ? 03/20/22 2110 03/20/22 2125  ?BP: 122/79 121/71  ?Pulse: 91 96  ?Resp: 15 (!) 21  ?Temp: 37.4 ?C   ?SpO2: 100% 100%  ?  ?Last Pain:  ?Vitals:  ? 03/20/22 2125  ?TempSrc:   ?PainSc: 0-No pain  ? ? ?  ?  ?  ?  ?  ?  ? ?Talana Slatten ? ? ? ? ?

## 2022-03-20 NOTE — H&P (Signed)
HPI ?Meredith Sullivan is a 21 y.o. G1P0 in early pregnancy who presents to MAU from Urgent Care with chief complaint of vaginal spotting. This is a new problem, onset yesterday 03/19/2022. Patient is unsure of LMP but reports taking Plan B after unprotected sex "sometime in March".  ?  ?Patient also reports lower abdominal pain and abdominal tenderness, onset yesterday. Pain score has ranged from 8-10/10 since onset. She has not taken medication for this complaint. ?  ?Patient has been NPO since 1130 today ? ?Pertinent OB/GYN History: ?Contraception: none, irregular use of Plan B ?DES exposure: denies ?Blood transfusions: none ?Sexually transmitted diseases: no past history ?No previous gyn procedures ?OB History: G1, P0  ? ?Menstrual History: ?Patient's last menstrual period was 03/20/2022. ?  ? ?Past Medical History:  ?Diagnosis Date  ? Medical history non-contributory   ? ? ?Past Surgical History:  ?Procedure Laterality Date  ? NO PAST SURGERIES    ? ? ?Family History  ?Problem Relation Age of Onset  ? Healthy Mother   ? Healthy Father   ? ? ?Social History:  reports that she has never smoked. She has never used smokeless tobacco. She reports current alcohol use. She reports that she does not currently use drugs. ? ?Allergies: No Known Allergies ? ?Medications Prior to Admission  ?Medication Sig Dispense Refill Last Dose  ? acetaminophen (TYLENOL) 500 MG tablet Take 1,000 mg by mouth 2 (two) times daily as needed for headache (pain).     ? Ascorbic Acid (VITAMIN C PO) Take 1 tablet by mouth 2 (two) times daily.     ? levonorgestrel (PLAN B 1-STEP) 1.5 MG tablet Take 1.5 mg by mouth once as needed (following sexual intercourse).     ? ? ?Review of Systems  ?Gastrointestinal:  Positive for abdominal pain.  ?Genitourinary:  Positive for vaginal bleeding.  ?All other systems reviewed and are negative. ? ?Blood pressure 127/62, pulse 90, temperature 98.7 ?F (37.1 ?C), temperature source Oral, resp. rate 17, height  5\' 4"  (1.626 m), weight 76.3 kg, last menstrual period 03/20/2022, SpO2 99 %. ? ?Physical Exam ?Vitals and nursing note reviewed. Exam conducted with a chaperone present.  ?Constitutional:   ?   Appearance: Normal appearance. She is ill-appearing.  ?Cardiovascular:  ?   Rate and Rhythm: Normal rate.  ?   Pulses: Normal pulses.  ?   Heart sounds: Normal heart sounds.  ?Pulmonary:  ?   Effort: Pulmonary effort is normal.  ?   Breath sounds: Normal breath sounds.  ?Abdominal:  ?   General: Abdomen is flat. Bowel sounds are normal.  ?   Tenderness: There is abdominal tenderness.  ?Genitourinary: ?   Comments: Deferred ?Skin: ?   Capillary Refill: Capillary refill takes less than 2 seconds.  ?Neurological:  ?   Mental Status: She is alert and oriented to person, place, and time.  ?Psychiatric:     ?   Mood and Affect: Mood normal.     ?   Behavior: Behavior normal.     ?   Thought Content: Thought content normal.     ?   Judgment: Judgment normal.  ?  ? ?Results for orders placed or performed during the hospital encounter of 03/20/22 (from the past 24 hour(s))  ?CBC     Status: Abnormal  ? Collection Time: 03/20/22  3:06 PM  ?Result Value Ref Range  ? WBC 10.6 (H) 4.0 - 10.5 K/uL  ? RBC 4.47 3.87 - 5.11 MIL/uL  ?  Hemoglobin 13.6 12.0 - 15.0 g/dL  ? HCT 39.6 36.0 - 46.0 %  ? MCV 88.6 80.0 - 100.0 fL  ? MCH 30.4 26.0 - 34.0 pg  ? MCHC 34.3 30.0 - 36.0 g/dL  ? RDW 14.5 11.5 - 15.5 %  ? Platelets 231 150 - 400 K/uL  ? nRBC 0.0 0.0 - 0.2 %  ?hCG, quantitative, pregnancy     Status: Abnormal  ? Collection Time: 03/20/22  3:06 PM  ?Result Value Ref Range  ? hCG, Beta Chain, Quant, S 6,452 (H) <5 mIU/mL  ?ABO/Rh     Status: None  ? Collection Time: 03/20/22  3:06 PM  ?Result Value Ref Range  ? ABO/RH(D) O POS   ? No rh immune globuloin    ?  NOT A RH IMMUNE GLOBULIN CANDIDATE, PT RH POSITIVE ?Performed at [facility], [address] [place], [place] [address] ?  ?Comprehensive metabolic panel     Status: Abnormal  ?  Collection Time: 03/20/22  3:06 PM  ?Result Value Ref Range  ? Sodium 139 135 - 145 mmol/L  ? Potassium 3.3 (L) 3.5 - 5.1 mmol/L  ? Chloride 108 98 - 111 mmol/L  ? CO2 24 22 - 32 mmol/L  ? Glucose, Bld 102 (H) 70 - 99 mg/dL  ? BUN 8 6 - 20 mg/dL  ? Creatinine, Ser 0.66 0.44 - 1.00 mg/dL  ? Calcium 9.3 8.9 - 10.3 mg/dL  ? Total Protein 7.2 6.5 - 8.1 g/dL  ? Albumin 4.3 3.5 - 5.0 g/dL  ? AST 19 15 - 41 U/L  ? ALT 22 0 - 44 U/L  ? Alkaline Phosphatase 58 38 - 126 U/L  ? Total Bilirubin 0.6 0.3 - 1.2 mg/dL  ? GFR, Estimated >60 >60 mL/min  ? Anion gap 7 5 - 15  ?Wet prep, genital     Status: Abnormal  ? Collection Time: 03/20/22  3:29 PM  ? Specimen: Vaginal  ?Result Value Ref Range  ? Yeast Wet Prep HPF POC NONE SEEN NONE SEEN  ? Trich, Wet Prep NONE SEEN NONE SEEN  ? Clue Cells Wet Prep HPF POC NONE SEEN NONE SEEN  ? WBC, Wet Prep HPF POC >=10 (A) <10  ? Sperm NONE SEEN   ? ? ?US OB LESS THAN 14 WEEKS WITH OB TRANSVAGINAL ? ?Result Date: 03/20/2022 ?CLINICAL DATA:  Abdominal pain, vaginal spotting EXAM: OBSTETRIC <14 WK ULTRASOUND TECHNIQUE: Transabdominal ultrasound was performed for evaluation of the gestation as well as the maternal uterus and adnexal regions. COMPARISON:  None. FINDINGS: Intrauterine gestational sac: None Yolk sac:  Not Visualized intrauterine. Embryo:  Not Visualized. Cardiac Activity: Not Visualized. Heart Rate: Not visualized. Subchorionic hemorrhage:  None visualized. Maternal uterus/adnexae: Suspected ectopic gestation in the right adnexa measuring 2.3 x 1.6 x 1.2 cm with an internal yolk sac but without fetal pole. Moderate nonspecific free fluid in the low pelvis. IMPRESSION: 1. Suspected ectopic gestation in the right adnexa measuring 2.3 cm with an internal yolk sac but without fetal pole. 2.  Moderate nonspecific free fluid in the low pelvis. These results were called by telephone at the time of interpretation on 03/20/2022 at 4:18 pm to [facility] Ariston Grandison , who verbally acknowledged  these results. Electronically Signed   By: Jearld LeschAlex D Bibbey M.D.   On: 03/20/2022 16:25   ? ?Assessment/Plan: ?--Right ectopic pregnancy ?--VSS, pain resolved with Dilaudid ?--Language barrier, in-person interpreter utilized for all patient interaction ?--Per Dr. Jolayne Pantheronstant, prep for OR ? ?  Calvert Cantor, MSA, MSN, CNM ?03/20/2022, 5:15 PM ? ?

## 2022-03-20 NOTE — MAU Note (Signed)
Farrel Gordon, RN, stated OR is ready for the patient.  ? ?This RN spoke with Juventino Slovak, Short Stay RN. Report given. RN requested patient to remove jewelry and have CHG bath completed. ?

## 2022-03-20 NOTE — ED Provider Notes (Signed)
?[facility] ? ? ? ?CSN: [id number] ?Arrival date & time: 03/20/22  1303 ? ? ?  ? ?History   ?Chief Complaint ?Chief Complaint  ?Patient presents with  ? Vaginal Bleeding  ? ? ?HPI ?Meredith Sullivan is a 21 y.o. female.  ? ?Pt presents with heavy bleeding.  States that she started bleeding about 13 days ago, reports initially started as normal period, but bleeding became more heavy and dark.  She is also experiencing abdominal cramping.  She denies fatigue, shortness of breath, pelvic pain, fever, chills.  ? ? ?History reviewed. No pertinent past medical history. ? ?There are no problems to display for this patient. ? ? ?History reviewed. No pertinent surgical history. ? ?OB History   ?No obstetric history on file. ?  ? ? ? ?Home Medications   ? ?Prior to Admission medications   ?Medication Sig Start Date End Date Taking? Authorizing Provider  ?acetaminophen (TYLENOL) 500 MG tablet Take 1,000 mg by mouth 2 (two) times daily as needed for headache (pain).    [provider]  ?Ascorbic Acid (VITAMIN C PO) Take 1 tablet by mouth 2 (two) times daily.    [provider]  ?levonorgestrel (PLAN B 1-STEP) 1.5 MG tablet Take 1.5 mg by mouth once as needed (following sexual intercourse).    [provider]  ? ? ?Family History ?Family History  ?Problem Relation Age of Onset  ? Healthy Mother   ? Healthy Father   ? ? ?Social History ?Social History  ? ?Tobacco Use  ? Smoking status: Never  ? Smokeless tobacco: Never  ?Vaping Use  ? Vaping Use: Some days  ? Substances: Flavoring  ?Substance Use Topics  ? Alcohol use: Never  ? Drug use: Not Currently  ? ? ? ?Allergies   ?Patient has no known allergies. ? ? ?Review of Systems ?Review of Systems  ?Constitutional:  Negative for chills and fever.  ?HENT:  Negative for ear pain and sore throat.   ?Eyes:  Negative for pain and visual disturbance.  ?Respiratory:  Negative for cough and shortness of breath.   ?Cardiovascular:  Negative for chest  pain and palpitations.  ?Gastrointestinal:  Positive for abdominal pain. Negative for vomiting.  ?Genitourinary:  Positive for vaginal bleeding. Negative for dysuria and hematuria.  ?Musculoskeletal:  Negative for arthralgias and back pain.  ?Skin:  Negative for color change and rash.  ?Neurological:  Negative for seizures and syncope.  ?All other systems reviewed and are negative. ? ? ?Physical Exam ?Triage Vital Signs ?ED Triage Vitals  ?Enc Vitals Group  ?   BP 03/20/22 1337 (!) 97/58  ?   Pulse Rate 03/20/22 1337 72  ?   Resp 03/20/22 1337 18  ?   Temp 03/20/22 1337 98.5 ?F (36.9 ?C)  ?   Temp src --   ?   SpO2 03/20/22 1337 98 %  ?   Weight --   ?   Height --   ?   Head Circumference --   ?   Peak Flow --   ?   Pain Score 03/20/22 1332 9  ?   Pain Loc --   ?   Pain Edu? --   ?   Excl. in GC? --   ? ?No data found. ? ?Updated Vital Signs ?BP (!) 97/58 (BP Location: Right Arm)   Pulse 72   Temp 98.5 ?F (36.9 ?C)   Resp 18   LMP 03/20/2022   SpO2 98%  ? ?Visual  Acuity ?Right Eye Distance:   ?Left Eye Distance:   ?Bilateral Distance:   ? ?Right Eye Near:   ?Left Eye Near:    ?Bilateral Near:    ? ?Physical Exam ?Vitals and nursing note reviewed.  ?Constitutional:   ?   General: She is not in acute distress. ?   Appearance: She is well-developed.  ?HENT:  ?   Head: Normocephalic and atraumatic.  ?Eyes:  ?   Conjunctiva/sclera: Conjunctivae normal.  ?Cardiovascular:  ?   Rate and Rhythm: Normal rate and regular rhythm.  ?   Heart sounds: No murmur heard. ?Pulmonary:  ?   Effort: Pulmonary effort is normal. No respiratory distress.  ?   Breath sounds: Normal breath sounds.  ?Abdominal:  ?   Palpations: Abdomen is soft.  ?   Tenderness: There is no abdominal tenderness.  ?Musculoskeletal:     ?   General: No swelling.  ?   Cervical back: Neck supple.  ?Skin: ?   General: Skin is warm and dry.  ?   Capillary Refill: Capillary refill takes less than 2 seconds.  ?Neurological:  ?   Mental Status: She is alert.   ?Psychiatric:     ?   Mood and Affect: Mood normal.  ? ? ? ?UC Treatments / Results  ?Labs ?(all labs ordered are listed, but only abnormal results are displayed) ?Labs Reviewed  ?POC URINE PREG, ED - Abnormal; Notable for the following components:  ?    Result Value  ? Preg Test, Ur POSITIVE (*)   ? All other components within normal limits  ? ? ?EKG ? ? ?Radiology ?No results found. ? ?Procedures ?Procedures (including critical care time) ? ?Medications Ordered in UC ?Medications - No data to display ? ?Initial Impression / Assessment and Plan / UC Course  ?I have reviewed the triage vital signs and the nursing notes. ? ?Pertinent labs & imaging results that were available during my care of the patient were reviewed by me and considered in my medical decision making (see chart for details). ? ?  ? ?Pt with positive pregnancy test, advised immediate evaluation in MAU.  Directions given to pt.  She drove herself here today and feels comfortable going to MAU on her own.  Spanish interpreter used.   ?Final Clinical Impressions(s) / UC Diagnoses  ? ?Final diagnoses:  ?Positive pregnancy test  ?Vaginal bleeding during pregnancy  ? ? ? ?Discharge Instructions   ? ?  ?Positive pregnancy test with heavy bleeding.  Follow up at [facility] for further evaluation, address listed above.  ? ? ? ?ED Prescriptions   ?None ?  ? ?PDMP not reviewed this encounter. ?  ?Ward, Tylene Fantasia, PA-C ?03/20/22 1428 ? ?

## 2022-03-20 NOTE — Transfer of Care (Signed)
Immediate Anesthesia Transfer of Care Note ? ?Patient: Meredith Sullivan ? ?Procedure(s) Performed: LAPAROSCOPIC RIGHT SALPINGECTOMY WITH REMOVAL OF ECTOPIC PREGNANCY (Right) ? ?Patient Location: PACU ? ?Anesthesia Type:General ? ?Level of Consciousness: awake, alert  and oriented ? ?Airway & Oxygen Therapy: Patient Spontanous Breathing and Patient connected to face mask oxygen ? ?Post-op Assessment: Report given to RN and Post -op Vital signs reviewed and stable ? ?Post vital signs: Reviewed and stable ? ?Last Vitals:  ?Vitals Value Taken Time  ?BP 121/71 03/20/22 2125  ?Temp 37.4 ?C 03/20/22 2110  ?Pulse 106 03/20/22 2127  ?Resp 18 03/20/22 2127  ?SpO2 100 % 03/20/22 2127  ?Vitals shown include unvalidated device data. ? ?Last Pain:  ?Vitals:  ? 03/20/22 2110  ?TempSrc:   ?PainSc: 0-No pain  ?   ? ?  ? ?Complications: No notable events documented. ?

## 2022-03-20 NOTE — Discharge Instructions (Addendum)
Positive pregnancy test with heavy bleeding.  Follow up at [facility] for further evaluation, address listed above.  ?

## 2022-03-20 NOTE — MAU Note (Signed)
This RN called Dorthey Sawyer, RN in [place]. OR to send for transport for patient.  ?

## 2022-03-20 NOTE — MAU Note (Signed)
Transport arrived to take patient to Main OR. Patients belongings sent with patients support person. ?

## 2022-03-20 NOTE — ED Triage Notes (Signed)
Pt presents today with vaginal bleeding. Pt states blood is heavy and very dark.  Pt states her menstrual cycle comes and goes. Pt states she had her period for 10 ten days, and then it stopped and came back again. Pt states abdominal cramping.  ?

## 2022-03-20 NOTE — Op Note (Signed)
Meredith Sullivan ?PROCEDURE DATE: 03/20/2022 ? ?PREOPERATIVE DIAGNOSIS: Ruptured ectopic pregnancy ?POSTOPERATIVE DIAGNOSIS: Ruptured right fallopian tube ectopic pregnancy ?PROCEDURE: Laparoscopic right salpingectomy and removal of ectopic pregnancy ?SURGEON:  Dr. Mora Bellman ?ASSISTANT: none ?ANESTHESIOLOGIST: Belinda Block, MD Anesthesiologist: Belinda Block, MD ?CRNA: Alain Marion, CRNA ? ?INDICATIONS: 21 y.o. G1P0 at Unknown here for with ruptured ectopic pregnancy. On exam, she had stable vital signs, and an acute abdomen. Hgb  ?Lab Results  ?Component Value Date  ? HGB 13.6 03/20/2022  ? ?, blood type O POS . Patient was counseled regarding need for laparoscopic salpingectomy. Risks of surgery including bleeding which may require transfusion or reoperation, infection, injury to bowel or other surrounding organs, need for additional procedures including laparotomy and other postoperative/anesthesia complications were explained to patient.  Written informed consent was obtained. ? ?FINDINGS:  Small amount of hemoperitoneum estimated to be about 50 cc of blood and clots.  Dilated right fallopian tube containing ectopic gestation. Small normal appearing uterus, normal left fallopian tube, right ovary and left ovary. ? ?ANESTHESIA: General ?INTRAVENOUS FLUIDS: .1500 ml ?ESTIMATED BLOOD LOSS:  50 mL  ?URINE OUTPUT: 150 ml ?SPECIMENS: right fallopian tube containing ectopic gestation ?COMPLICATIONS: None immediate ? ?PROCEDURE IN DETAIL:  The patient was taken to the operating room where general anesthesia was administered and was found to be adequate.  She was placed in the dorsal lithotomy position, and was prepped and draped in a sterile manner.  A Foley catheter was inserted into her bladder and attached to Meredith Sullivan drainage and a uterine manipulator was then advanced into the uterus .  After an adequate timeout was performed, attention was then turned to the patient's abdomen where a 10-mm skin  incision was made on the umbilical fold.  The fascia was identified, tented up and incised with Mayo scissors. The fascia was tagged with 0- Vicryl. The peritoneum was identified tented up and entered sharply with Metzenbaum scissors.  10-mm trocar and sleeve were then advanced without difficulty into the abdomen where intraabdominal placement was confirmed by the laparoscope. Pneumoperitoneum was achieved with insufflation of CO2 gas. A survey of the patient's pelvis and abdomen revealed the findings as above.  Two left lower quadrant ports were placed under direct visualization; 5-mm superior and medial to the superior iliac spine and the other 5-mm port placed 5 cm cephalad from the first one.  The 5-mm Nezhat suction irrigator was then used to suction the hemoperitoneum and irrigate the pelvis.  Attention was then turned to the right fallopian tube which was grasped and ligated from the underlying mesosalpinx and uterine attachment using the Harmonic instrument.  Good hemostasis was noted.  The specimen was placed in an EndoCatch bag and removed from the abdomen intact.  The abdomen was desufflated, and all instruments were removed.  The fascial incisions of both 10-mm sites were reapproximated with 0 Vicryl figure-of-eight stiches; and all skin incisions were closed with a 3-0 Vicryl subcuticular stitch. The patient tolerated the procedures well.  All instruments, needles, and sponge counts were correct x 2. The patient was taken to the recovery room in stable condition.  ? ?The patient will be discharged to home as per PACU criteria.  Routine postoperative instructions given.  She was prescribed Percocet, Ibuprofen and Colace.  She will follow up in the clinic in 2 weeks for postoperative evaluation . ? ?

## 2022-03-20 NOTE — Anesthesia Preprocedure Evaluation (Addendum)
Anesthesia Evaluation  ?Patient identified by MRN, date of birth, ID band ?Patient awake ? ? ? ?Reviewed: ?Allergy & Precautions, NPO status , Patient's Chart, lab work & pertinent test results ? ?Airway ?Mallampati: II ? ?TM Distance: >3 FB ? ? ? ? Dental ?  ?Pulmonary ? ?  ?breath sounds clear to auscultation ? ? ? ? ? ? Cardiovascular ?negative cardio ROS ? ? ?Rhythm:Regular Rate:Normal ? ? ?  ?Neuro/Psych ?  ? GI/Hepatic ?Neg liver ROS, History noted ?Dr. Chilton Si ?  ?Endo/Other  ?negative endocrine ROS ? Renal/GU ?negative Renal ROS  ? ?  ?Musculoskeletal ? ? Abdominal ?  ?Peds ? Hematology ?  ?Anesthesia Other Findings ? ? Reproductive/Obstetrics ? ?  ? ? ? ? ? ? ? ? ? ? ? ? ? ?  ?  ? ? ? ? ? ? ? ? ?Anesthesia Physical ?Anesthesia Plan ? ?ASA: 1 and emergent ? ?Anesthesia Plan: General  ? ?Post-op Pain Management:   ? ?Induction:  ? ?PONV Risk Score and Plan: 3 and Ondansetron, Dexamethasone and Midazolam ? ?Airway Management Planned: Oral ETT ? ?Additional Equipment:  ? ?Intra-op Plan:  ? ?Post-operative Plan: Extubation in OR ? ?Informed Consent:  ? ? ? ?Dental advisory given ? ?Plan Discussed with: Anesthesiologist and CRNA ? ?Anesthesia Plan Comments:   ? ? ? ? ? ?Anesthesia Quick Evaluation ? ?

## 2022-03-20 NOTE — Anesthesia Procedure Notes (Signed)
Procedure Name: Intubation ?Date/Time: 03/20/2022 7:51 PM ?Performed by: Mayer Camel, CRNA ?Pre-anesthesia Checklist: Patient identified, Emergency Drugs available, Suction available and Patient being monitored ?Patient Re-evaluated:Patient Re-evaluated prior to induction ?Oxygen Delivery Method: Circle System Utilized ?Preoxygenation: Pre-oxygenation with 100% oxygen ?Induction Type: IV induction and Rapid sequence ?Ventilation: Mask ventilation without difficulty ?Laryngoscope Size: Hyacinth Meeker and 2 ?Grade View: Grade I ?Tube type: Oral ?Tube size: 7.0 mm ?Number of attempts: 1 ?Airway Equipment and Method: Stylet ?Placement Confirmation: ETT inserted through vocal cords under direct vision, positive ETCO2 and breath sounds checked- equal and bilateral ?Secured at: 22 cm ?Tube secured with: Tape ?Dental Injury: Teeth and Oropharynx as per pre-operative assessment  ? ? ? ? ?

## 2022-03-20 NOTE — MAU Note (Signed)
Notified Main Lab of patient's CMP add-on. 

## 2022-03-21 ENCOUNTER — Other Ambulatory Visit: Payer: Self-pay | Admitting: Family Medicine

## 2022-03-21 ENCOUNTER — Encounter (HOSPITAL_COMMUNITY): Payer: Self-pay | Admitting: Obstetrics and Gynecology

## 2022-03-21 LAB — GC/CHLAMYDIA PROBE AMP (~~LOC~~) NOT AT ARMC
Chlamydia: NEGATIVE
Comment: NEGATIVE
Comment: NORMAL
Neisseria Gonorrhea: NEGATIVE

## 2022-03-21 LAB — TYPE AND SCREEN
ABO/RH(D): O POS
Antibody Screen: NEGATIVE

## 2022-03-21 MED ORDER — HYDROCODONE-ACETAMINOPHEN 5-325 MG PO TABS: 1.0000 | ORAL_TABLET | Freq: Four times a day (QID) | ORAL | 0 refills | Status: DC | PRN

## 2022-03-21 NOTE — Progress Notes (Signed)
Patient called and stated that [facility] did not have any oxycodone--Attempts to call pharmacy could not be completed. ?Hydrocodone called in for postop pain. ?

## 2022-03-22 LAB — SURGICAL PATHOLOGY

## 2022-03-24 ENCOUNTER — Encounter (HOSPITAL_COMMUNITY): Payer: Self-pay | Admitting: Pharmacy Technician

## 2022-03-24 ENCOUNTER — Other Ambulatory Visit: Payer: Self-pay

## 2022-03-24 ENCOUNTER — Emergency Department (HOSPITAL_COMMUNITY)
Admission: EM | Admit: 2022-03-24 | Discharge: 2022-03-25 | Disposition: A | Discharge status: Home / Self Care | Payer: Self-pay | Attending: Emergency Medicine | Admitting: Emergency Medicine

## 2022-03-24 DIAGNOSIS — R079 Chest pain, unspecified: Secondary | ICD-10-CM | POA: Insufficient documentation

## 2022-03-24 DIAGNOSIS — R0789 Other chest pain: Secondary | ICD-10-CM

## 2022-03-24 DIAGNOSIS — Z9079 Acquired absence of other genital organ(s): Secondary | ICD-10-CM | POA: Insufficient documentation

## 2022-03-24 DIAGNOSIS — R1032 Left lower quadrant pain: Secondary | ICD-10-CM | POA: Insufficient documentation

## 2022-03-24 LAB — COMPREHENSIVE METABOLIC PANEL
ALT: 18 U/L (ref 0–44)
AST: 17 U/L (ref 15–41)
Albumin: 4 g/dL (ref 3.5–5.0)
Alkaline Phosphatase: 50 U/L (ref 38–126)
Anion gap: 5 (ref 5–15)
BUN: 5 mg/dL — ABNORMAL LOW (ref 6–20)
CO2: 27 mmol/L (ref 22–32)
Calcium: 9.1 mg/dL (ref 8.9–10.3)
Chloride: 107 mmol/L (ref 98–111)
Creatinine, Ser: 0.56 mg/dL (ref 0.44–1.00)
GFR, Estimated: >60 mL/min (ref >60)
Glucose, Bld: 105 mg/dL — ABNORMAL HIGH (ref 70–99)
Potassium: 3.8 mmol/L (ref 3.5–5.1)
Sodium: 139 mmol/L (ref 135–145)
Total Bilirubin: 0.5 mg/dL (ref 0.3–1.2)
Total Protein: 6.5 g/dL (ref 6.5–8.1)

## 2022-03-24 LAB — URINALYSIS, ROUTINE W REFLEX MICROSCOPIC
Bilirubin Urine: NEGATIVE
Glucose, UA: NEGATIVE mg/dL
Ketones, ur: NEGATIVE mg/dL
Leukocytes,Ua: NEGATIVE
Nitrite: NEGATIVE
Protein, ur: NEGATIVE mg/dL
Specific Gravity, Urine: 1.003 — ABNORMAL LOW (ref 1.005–1.030)
pH: 8 (ref 5.0–8.0)

## 2022-03-24 LAB — CBC
HCT: 38.4 % (ref 36.0–46.0)
Hemoglobin: 12.8 g/dL (ref 12.0–15.0)
MCH: 30.5 pg (ref 26.0–34.0)
MCHC: 33.3 g/dL (ref 30.0–36.0)
MCV: 91.6 fL (ref 80.0–100.0)
Platelets: 229 10*3/uL (ref 150–400)
RBC: 4.19 MIL/uL (ref 3.87–5.11)
RDW: 14.6 % (ref 11.5–15.5)
WBC: 7.9 10*3/uL (ref 4.0–10.5)
nRBC: 0 % (ref 0.0–0.2)

## 2022-03-24 LAB — LIPASE, BLOOD: Lipase: 29 U/L (ref 11–51)

## 2022-03-24 MED ORDER — SODIUM CHLORIDE 0.9 % IV BOLUS
1000.0000 mL | Freq: Once | INTRAVENOUS | Status: AC
  Administered 2022-03-25: 1000 mL via INTRAVENOUS

## 2022-03-24 NOTE — ED Provider Triage Note (Signed)
Emergency Medicine Provider Triage Evaluation Note ? ?Meredith Sullivan , a 21 y.o. female  was evaluated in triage.  Pt complains of dizziness and nausea. Had surgery for ectopic pregnancy on 4/10. She was having pain and took her prescribed pain medication and then started feeling nauseous and dizzy.  ? ?Review of Systems  ?Positive: Abdominal pain, nausea, dizziness ?Negative: Vomiting, diarrhea, fever ? ?Physical Exam  ?BP 124/73   Pulse 66   Temp 98.5 ?F (36.9 ?C) (Oral)   Resp 18   LMP 03/20/2022   SpO2 99%  ?Gen:   Awake, no distress   ?Resp:  Normal effort  ?MSK:   Moves extremities without difficulty  ?Other:   ? ?Medical Decision Making  ?Medically screening exam initiated at 6:18 PM.  Appropriate orders placed.  Meredith Sullivan was informed that the remainder of the evaluation will be completed by another provider, this initial triage assessment does not replace that evaluation, and the importance of remaining in the ED until their evaluation is complete. ? ? ?  ?Su Monks, PA-C ?03/24/22 1818 ? ?

## 2022-03-24 NOTE — ED Triage Notes (Signed)
Pt here with reports of worsening abd pain today after recent surgery for ectopic pregnancy. Pt also complains of dizziness and nausea.  ?

## 2022-03-24 NOTE — ED Provider Notes (Signed)
?[facility] EMERGENCY DEPARTMENT ?Provider Note ? ?CSN: [id number] ?Arrival date & time: 03/24/22 1612 ? ?Chief Complaint(s) ?Abdominal Pain and Chest Pain ? ?HPI ?Meredith Sullivan is a 21 y.o. female with a past medical history listed below including recent ectopic pregnancy requiring laparoscopic surgery 4 days ago who presents to the ED for sudden onset substernal/left-sided chest pain described as aching pressure.  Nonradiating.  Nonexertional.  Associated with shortness of breath and lightheadedness.  This occurred around noon this afternoon.  It lasted for approximately 4 hours and began to subside.  She endorses nausea without emesis.  She is endorsing constipation since the surgery.  She reports that she has been having abdominal discomfort since the surgery.  It is controlled with Norco which she took earlier in the morning. ? ?No coughing or congestion.  No fevers or chills.  No other physical complaints. ? ? ?Abdominal Pain ? ?Past Medical History ?Past Medical History:  ?Diagnosis Date  ? Medical history non-contributory   ? ?Patient Active Problem List  ? Diagnosis Date Noted  ? Right tubal pregnancy without intrauterine pregnancy   ? ?Home Medication(s) ?Prior to Admission medications   ?Medication Sig Start Date End Date Taking? Authorizing Provider  ?HYDROcodone-acetaminophen (NORCO/VICODIN) 5-325 MG tablet Take 1 tablet by mouth every 6 (six) hours as needed for moderate pain. 03/21/22  Yes Reva Bores, MD  ?ibuprofen (ADVIL) 600 MG tablet Take 1 tablet (600 mg total) by mouth every 6 (six) hours as needed. ?Patient taking differently: Take 600 mg by mouth every 6 (six) hours as needed for headache or moderate pain. 03/20/22  Yes Constant, Gigi Gin, MD  ?                                                                                                                                  ?Allergies ?Patient has no known allergies. ? ?Review of Systems ?Review of Systems   ?Gastrointestinal:  Positive for abdominal pain.  ?As noted in HPI ? ?Physical Exam ?Vital Signs  ?I have reviewed the triage vital signs ?BP 107/69   Pulse 63   Temp 98.3 ?F (36.8 ?C) (Oral)   Resp 19   LMP 03/20/2022   SpO2 99%  ? ?Physical Exam ?Vitals reviewed.  ?Constitutional:   ?   General: She is not in acute distress. ?   Appearance: She is well-developed. She is not diaphoretic.  ?HENT:  ?   Head: Normocephalic and atraumatic.  ?   Nose: Nose normal.  ?Eyes:  ?   General: No scleral icterus.    ?   Right eye: No discharge.     ?   Left eye: No discharge.  ?   Conjunctiva/sclera: Conjunctivae normal.  ?   Pupils: Pupils are equal, round, and reactive to light.  ?Cardiovascular:  ?   Rate and Rhythm: Normal rate and regular rhythm.  ?   Heart sounds: No murmur  heard. ?  No friction rub. No gallop.  ?Pulmonary:  ?   Effort: Pulmonary effort is normal. No respiratory distress.  ?   Breath sounds: Normal breath sounds. No stridor. No rales.  ?Abdominal:  ?   General: There is no distension.  ?   Palpations: Abdomen is soft.  ?   Tenderness: There is abdominal tenderness (mild) in the left lower quadrant.  ?   Comments: Trochar incisions are C/D/I.  ?Musculoskeletal:     ?   General: No tenderness.  ?   Cervical back: Normal range of motion and neck supple.  ?Skin: ?   General: Skin is warm and dry.  ?   Findings: No erythema or rash.  ?Neurological:  ?   Mental Status: She is alert and oriented to person, place, and time.  ? ? ?ED Results and Treatments ?Labs ?(all labs ordered are listed, but only abnormal results are displayed) ?Labs Reviewed  ?COMPREHENSIVE METABOLIC PANEL - Abnormal; Notable for the following components:  ?    Result Value  ? Glucose, Bld 105 (*)   ? BUN 5 (*)   ? All other components within normal limits  ?URINALYSIS, ROUTINE W REFLEX MICROSCOPIC - Abnormal; Notable for the following components:  ? Color, Urine STRAW (*)   ? Specific Gravity, Urine 1.003 (*)   ? Hgb urine dipstick  LARGE (*)   ? Bacteria, UA FEW (*)   ? All other components within normal limits  ?LIPASE, BLOOD  ?CBC  ?I-STAT BETA HCG BLOOD, ED ([facility], [facility], [facility] ONLY)  ?TROPONIN I (HIGH SENSITIVITY)  ?TROPONIN I (HIGH SENSITIVITY)  ?                                                                                                                       ?EKG ? EKG Interpretation ? ?Date/Time:  Saturday March 25 2022 00:27:56 EDT ?Ventricular Rate:  70 ?PR Interval:  159 ?QRS Duration: 91 ?QT Interval:  398 ?QTC Calculation: 430 ?R Axis:   91 ?Text Interpretation: Sinus rhythm Borderline right axis deviation Confirmed by Drema Pry [phone]) on 03/25/2022 2:57:41 AM ?  ? ?  ? ?Radiology ?CT Angio Chest PE W and/or Wo Contrast ? ?Result Date: 03/25/2022 ?CLINICAL DATA:  Status post ectopic pregnancy removal. Abdominal pain and chest pain. EXAM: CT ANGIOGRAPHY CHEST CT ABDOMEN AND PELVIS WITH CONTRAST TECHNIQUE: Multidetector CT imaging of the chest was performed using the standard protocol during bolus administration of intravenous contrast. Multiplanar CT image reconstructions and MIPs were obtained to evaluate the vascular anatomy. Multidetector CT imaging of the abdomen and pelvis was performed using the standard protocol during bolus administration of intravenous contrast. RADIATION DOSE REDUCTION: This exam was performed according to the departmental dose-optimization program which includes automated exposure control, adjustment of the mA and/or kV according to patient size and/or use of iterative reconstruction technique. CONTRAST:  OMNIPAQUE IOHEXOL 350 MG/ML SOLN COMPARISON:  None. FINDINGS: CTA CHEST FINDINGS Cardiovascular: Satisfactory opacification of the pulmonary  arteries to the segmental level. No evidence of pulmonary embolism. Normal heart size. No pericardial effusion. Mediastinum/Nodes: No enlarged mediastinal, hilar, or axillary lymph nodes. Thyroid gland, trachea, and esophagus demonstrate no significant  findings. Lungs/Pleura: Lungs are clear.  No pleural effusion or pneumothorax. Musculoskeletal: No abdominal wall hernia identified. Small amount of abdominal wall air is compatible with recent surgery. Review of the MIP images confirms the above findings. CT ABDOMEN and PELVIS FINDINGS Hepatobiliary: No focal liver abnormality is seen. No gallstones, gallbladder wall thickening, or biliary dilatation. Pancreas: Unremarkable. No pancreatic ductal dilatation or surrounding inflammatory changes. Spleen: Normal in size without focal abnormality. Adrenals/Urinary Tract: Adrenal glands are unremarkable. Kidneys are normal, without renal calculi, focal lesion, or hydronephrosis. Bladder is unremarkable. Stomach/Bowel: Stomach is within normal limits. Appendix appears normal. No evidence of bowel wall thickening, distention, or inflammatory changes. Vascular/Lymphatic: No significant vascular findings are present. No enlarged abdominal or pelvic lymph nodes. Reproductive: Uterus and bilateral adnexa are unremarkable. Other: There is a small amount of free fluid in the pelvis. Small area of hyperdensity may represent hematoma in the right adnexa measuring 1.5 x 1.1 cm. There is a small amount of free air anomaly in the pelvis, but also minimally in the upper abdomen compatible with recent surgery. Musculoskeletal: No acute or significant osseous findings. Review of the MIP images confirms the above findings. IMPRESSION: 1. No evidence for pulmonary embolism. No acute cardiopulmonary process. 2. Small amount of free intraperitoneal air and air within the abdominal wall compatible with patient's history of recent surgery. 3. Small amount of free fluid in the pelvis with likely small pelvic hematoma in the right adnexa. Electronically Signed   By: Darliss CheneyAmy  Guttmann M.D.   On: 03/25/2022 01:13  ? ?CT ABDOMEN PELVIS W CONTRAST ? ?Result Date: 03/25/2022 ?CLINICAL DATA:  Status post ectopic pregnancy removal. Abdominal pain and chest  pain. EXAM: CT ANGIOGRAPHY CHEST CT ABDOMEN AND PELVIS WITH CONTRAST TECHNIQUE: Multidetector CT imaging of the chest was performed using the standard protocol during bolus administration of intravenous contrast. Multiplanar CT ima

## 2022-03-25 ENCOUNTER — Emergency Department (HOSPITAL_COMMUNITY): Payer: Self-pay

## 2022-03-25 LAB — TROPONIN I (HIGH SENSITIVITY)
Troponin I (High Sensitivity): <2 ng/L (ref <18)
Troponin I (High Sensitivity): <2 ng/L (ref <18)

## 2022-03-25 MED ORDER — IOHEXOL 350 MG/ML SOLN
100.0000 mL | Freq: Once | INTRAVENOUS | Status: AC | PRN
  Administered 2022-03-25: 100 mL via INTRAVENOUS

## 2022-03-27 ENCOUNTER — Encounter: Payer: Self-pay | Admitting: Obstetrics and Gynecology

## 2022-03-28 ENCOUNTER — Encounter: Payer: Self-pay | Admitting: Obstetrics and Gynecology

## 2022-04-06 ENCOUNTER — Ambulatory Visit (INDEPENDENT_AMBULATORY_CARE_PROVIDER_SITE_OTHER): Payer: Self-pay | Admitting: Obstetrics and Gynecology

## 2022-04-06 ENCOUNTER — Encounter: Payer: Self-pay | Admitting: Obstetrics and Gynecology

## 2022-04-06 VITALS — BP 112/75 | HR 71 | Ht 64.0 in | Wt 163.0 lb

## 2022-04-06 DIAGNOSIS — Z9889 Other specified postprocedural states: Secondary | ICD-10-CM

## 2022-04-06 MED ORDER — NORGESTIMATE-ETH ESTRADIOL 0.25-35 MG-MCG PO TABS: 1.0000 | ORAL_TABLET | Freq: Every day | ORAL | 11 refills | Status: DC

## 2022-04-06 MED ORDER — XULANE 150-35 MCG/24HR TD PTWK: 1.0000 | MEDICATED_PATCH | TRANSDERMAL | 12 refills | Status: DC

## 2022-04-06 NOTE — Progress Notes (Signed)
Postop f/u ectopic ?Reports some mild pain left abdomen, sts it is getting better ? ?

## 2022-04-06 NOTE — Progress Notes (Signed)
21 yo P0010 s/p salpingectomy for the treatment of a ruptured ectopic pregnancy here for post op check. Patient reports feeling well and has returned to work last week. She denies fever, or drainage from her incision. She denies any pain ? ?Past Medical History:  ?Diagnosis Date  ? Medical history non-contributory   ? ?Past Surgical History:  ?Procedure Laterality Date  ? LAPAROSCOPIC UNILATERAL SALPINGECTOMY Right 03/20/2022  ? Procedure: LAPAROSCOPIC RIGHT SALPINGECTOMY WITH REMOVAL OF ECTOPIC PREGNANCY;  Surgeon: Catalina Antigua, MD;  Location: [facility];  Service: Gynecology;  Laterality: Right;  ? NO PAST SURGERIES    ? ?Family History  ?Problem Relation Age of Onset  ? Healthy Mother   ? Healthy Father   ? ?Social History  ? ?Tobacco Use  ? Smoking status: Never  ? Smokeless tobacco: Never  ?Vaping Use  ? Vaping Use: Some days  ? Substances: Flavoring  ?Substance Use Topics  ? Alcohol use: Yes  ? Drug use: Not Currently  ?  Comment: Last used this past Saturday 03/20/2022  ? ?ROS ?See pertinent in HPI. All other systems reviewed and non contributory ?Blood pressure 112/75, pulse 71, height 5\' 4"  (1.626 m), weight 163 lb (73.9 kg), last menstrual period 03/20/2022. ?GENERAL: Well-developed, well-nourished female in no acute distress.  ?ABDOMEN: Soft, nontender, nondistended. No organomegaly. ?Incisions: no erythema, induration or drainage ?PELVIC: Not indicated ?EXTREMITIES: No cyanosis, clubbing, or edema, 2+ distal pulses. ? ?A/P 21 yo here for post op check ?- Patient is medically cleared to resume all activities ?- This pregnancy was not planned and patient is interested in contraception. Options reviewed and patient opted for COC- Rx provided ?- RTC in November for annual exam with pap smear ? ?

## 2022-06-28 ENCOUNTER — Inpatient Hospital Stay (HOSPITAL_COMMUNITY)
Admission: AD | Admit: 2022-06-28 | Discharge: 2022-06-28 | Disposition: A | Discharge status: Home / Self Care | Payer: Self-pay | Attending: Obstetrics and Gynecology | Admitting: Obstetrics and Gynecology

## 2022-06-28 ENCOUNTER — Other Ambulatory Visit: Payer: Self-pay

## 2022-06-28 ENCOUNTER — Encounter (HOSPITAL_COMMUNITY): Payer: Self-pay | Admitting: Obstetrics and Gynecology

## 2022-06-28 DIAGNOSIS — Z369 Encounter for antenatal screening, unspecified: Secondary | ICD-10-CM | POA: Insufficient documentation

## 2022-06-28 DIAGNOSIS — Z3A01 Less than 8 weeks gestation of pregnancy: Secondary | ICD-10-CM | POA: Insufficient documentation

## 2022-06-28 NOTE — MAU Provider Note (Signed)
Event Date/Time   First Provider Initiated Contact with Patient 06/28/22 1310      S [name] is a 21 y.o. G2P0 patient who presents to MAU today requesting an ultrasound. She reports occasional abdominal pain but denies any currently. Denies any vaginal bleeding  She was seen at [facility]  on 7/13 and 7/15. She had an ultrasound that showed a gestational sac and yolk sac with normally rising HCGs. She was scheduled for a repeat ultrasound on 7/31 for viability.   She presents to MAU because she wants an ultrasound sooner.   O BP (!) 103/59 (BP Location: Right Arm)   Pulse 74   Temp 98.1 F (36.7 C) (Oral)   Resp 16   Wt 72.4 kg   LMP  (LMP Unknown) Comment: vaginal bleeding after ectopic, finished 4/20 or 4/21  SpO2 98% Comment: room air  Breastfeeding Unknown   BMI 27.41 kg/m  Physical Exam Vitals and nursing note reviewed.  Constitutional:      General: She is not in acute distress.    Appearance: She is well-developed.  HENT:     Head: Normocephalic.  Eyes:     Pupils: Pupils are equal, round, and reactive to light.  Cardiovascular:     Rate and Rhythm: Normal rate and regular rhythm.     Heart sounds: Normal heart sounds.  Pulmonary:     Effort: Pulmonary effort is normal. No respiratory distress.     Breath sounds: Normal breath sounds.  Abdominal:     General: Bowel sounds are normal. There is no distension.     Palpations: Abdomen is soft.     Tenderness: There is no abdominal tenderness.  Skin:    General: Skin is warm and dry.  Neurological:     Mental Status: She is alert and oriented to person, place, and time.  Psychiatric:        Mood and Affect: Mood normal.        Behavior: Behavior normal.        Thought Content: Thought content normal.        Judgment: Judgment normal.     A Medical screening exam complete 1. Patient requested ultrasound scan, antenatal   2. [redacted] weeks gestation of pregnancy    CNM discussed with patient no  indication for ultrasound today and explained reasoning for waiting 2 weeks to repeat ultrasound. Discussed need to allow time for pregnancy to grown and likelihood that ultrasound today would be similar to previous.   CNM offered to make outpatient appointment in [facility] for ultrasound if travel was too inconvenient for patient. Patient declines stating she "just came to get an ultrasound sooner and if we can't do it sooner, she will keep her previously scheduled appointment."  P Discharge from MAU in stable condition Patient given the option of transfer to [facility] for further evaluation or seek care in outpatient facility of choice  List of options for follow-up given  Warning signs for worsening condition that would warrant emergency follow-up discussed Patient may return to MAU as needed   Rolm Bookbinder, CNM 06/28/2022 1:10 PM

## 2022-06-28 NOTE — Discharge Instructions (Signed)

## 2022-06-28 NOTE — MAU Note (Signed)
Meredith Sullivan is a 21 y.o. here in MAU reporting: has had 2 u/s and they told her she would need another one to confirm viability. States next u/s is not scheduled due to insurance. Is here today for an u/s and because she is having a little bit of pain. No bleeding.  LMP: unknown, states possibly may  Onset of complaint: ongoing since last week  Pain score: 5/10  Vitals:   06/28/22 1307  BP: (!) 103/59  Pulse: 74  Resp: 16  Temp: 98.1 F (36.7 C)  SpO2: 98%     Lab orders placed from triage: none

## 2023-02-08 ENCOUNTER — Encounter (HOSPITAL_COMMUNITY): Payer: Self-pay

## 2023-02-08 ENCOUNTER — Ambulatory Visit (HOSPITAL_COMMUNITY)
Admission: EM | Admit: 2023-02-08 | Discharge: 2023-02-08 | Disposition: A | Discharge status: Home / Self Care | Payer: Self-pay | Attending: Internal Medicine | Admitting: Internal Medicine

## 2023-02-08 DIAGNOSIS — R6889 Other general symptoms and signs: Secondary | ICD-10-CM | POA: Insufficient documentation

## 2023-02-08 DIAGNOSIS — Z1152 Encounter for screening for COVID-19: Secondary | ICD-10-CM | POA: Insufficient documentation

## 2023-02-08 DIAGNOSIS — H6502 Acute serous otitis media, left ear: Secondary | ICD-10-CM | POA: Insufficient documentation

## 2023-02-08 DIAGNOSIS — R058 Other specified cough: Secondary | ICD-10-CM | POA: Insufficient documentation

## 2023-02-08 MED ORDER — OSELTAMIVIR PHOSPHATE 75 MG PO CAPS: 75.0000 mg | ORAL_CAPSULE | Freq: Two times a day (BID) | ORAL | 0 refills | Status: DC

## 2023-02-08 MED ORDER — BENZONATATE 200 MG PO CAPS: 200.0000 mg | ORAL_CAPSULE | Freq: Three times a day (TID) | ORAL | 0 refills | Status: DC | PRN

## 2023-02-08 MED ORDER — PSEUDOEPHEDRINE HCL ER 120 MG PO TB12: 120.0000 mg | ORAL_TABLET | Freq: Two times a day (BID) | ORAL | 0 refills | Status: DC

## 2023-02-08 NOTE — ED Provider Notes (Signed)
[name]    CSN: [id number] Arrival date & time: 02/08/23  1513      History   Chief Complaint Chief Complaint  Patient presents with   Fever   Otalgia    HPI Meredith Sullivan is a 22 y.o. female presents with hx of HA, body aches, cough, rhinitis and L ear pain x 2 days. Has had a subjective fever. Her HA has not been responding to OTC meds.  Denies GI symptoms or rash.     Past Medical History:  Diagnosis Date   Medical history non-contributory     Patient Active Problem List   Diagnosis Date Noted   Right tubal pregnancy without intrauterine pregnancy     Past Surgical History:  Procedure Laterality Date   LAPAROSCOPIC UNILATERAL SALPINGECTOMY Right 03/20/2022   Procedure: LAPAROSCOPIC RIGHT SALPINGECTOMY WITH REMOVAL OF ECTOPIC PREGNANCY;  Surgeon: Mora Bellman, MD;  Location: [facility];  Service: Gynecology;  Laterality: Right;   NO PAST SURGERIES      OB History     Gravida  2   Para      Term      Preterm      AB  1   Living         SAB      IAB  1   Ectopic      Multiple      Live Births               Home Medications    Prior to Admission medications   Medication Sig Start Date End Date Taking? Authorizing Provider  benzonatate (TESSALON) 200 MG capsule Take 1 capsule (200 mg total) by mouth 3 (three) times daily as needed for cough. 02/08/23  Yes Rodriguez-Southworth, Sunday Spillers, PA-C  oseltamivir (TAMIFLU) 75 MG capsule Take 1 capsule (75 mg total) by mouth every 12 (twelve) hours. 02/08/23  Yes Rodriguez-Southworth, Sunday Spillers, PA-C  pseudoephedrine (SUDAFED) 120 MG 12 hr tablet Take 1 tablet (120 mg total) by mouth 2 (two) times daily. 02/08/23  Yes Rodriguez-Southworth, Sunday Spillers, PA-C    Family History Family History  Problem Relation Age of Onset   Healthy Mother    Healthy Father     Social History Social History   Tobacco Use   Smoking status: Never   Smokeless tobacco: Never  Vaping Use   Vaping Use:  Some days   Substances: Flavoring  Substance Use Topics   Alcohol use: Yes   Drug use: Never    Comment: Last used this past Saturday 03/20/2022     Allergies   Patient has no known allergies.   Review of Systems Review of Systems As noted in HPI  Physical Exam Triage Vital Signs ED Triage Vitals [02/08/23 1559]  Enc Vitals Group     BP 110/74     Pulse Rate 64     Resp 16     Temp 98.4 F (36.9 C)     Temp Source Oral     SpO2 98 %     Weight      Height      Head Circumference      Peak Flow      Pain Score 8     Pain Loc      Pain Edu?      Excl. in [place]?    No data found.  Updated Vital Signs BP 110/74 (BP Location: Left Arm)   Pulse 64   Temp 98.4 F (36.9 C) (Oral)  Resp 16   LMP  (LMP Unknown) Comment: vaginal bleeding after ectopic, finished 4/20 or 4/21  SpO2 98%   Visual Acuity Right Eye Distance:   Left Eye Distance:   Bilateral Distance:    Right Eye Near:   Left Eye Near:    Bilateral Near:     Physical Exam Physical Exam Vitals signs and nursing note reviewed.  Constitutional:      General: She is not in acute distress.    Appearance: Normal appearance. She is not ill-appearing, toxic-appearing or diaphoretic.  HENT:     Head: Normocephalic.     Right Ear: Tympanic membrane, ear canal and external ear normal.     Left Ear: Tympanic membrane dull and gray , but ear canal and external ear normal.     Nose: Moderate nose congestion with clear mucous     Mouth/Throat: clear    Mouth: Mucous membranes are moist.  Eyes:     General: No scleral icterus.       Right eye: No discharge.        Left eye: No discharge.     Conjunctiva/sclera: Conjunctivae normal.  Neck:     Musculoskeletal: Neck supple. No neck rigidity.  Cardiovascular:     Rate and Rhythm: Normal rate and regular rhythm.     Heart sounds: No murmur.  Pulmonary:     Effort: Pulmonary effort is normal.     Breath sounds: Normal breath sounds.  Musculoskeletal: Normal  range of motion.  Lymphadenopathy:     Cervical: No cervical adenopathy.  Skin:    General: Skin is warm and dry.     Coloration: Skin is not jaundiced.     Findings: No rash.  Neurological:     Mental Status: She is alert and oriented to person, place, and time.     Gait: Gait normal.  Psychiatric:        Mood and Affect: Mood normal.        Behavior: Behavior normal.        Thought Content: Thought content normal.        Judgment: Judgment normal.    UC Treatments / Results  Labs (all labs ordered are listed, but only abnormal results are displayed) Labs Reviewed  SARS CORONAVIRUS 2 (TAT 6-24 HRS)    EKG   Radiology No results found.  Procedures Procedures (including critical care time)  Medications Ordered in UC Medications - No data to display  Initial Impression / Assessment and Plan / UC Course  I have reviewed the triage vital signs and the nursing notes.  We are out of Influenza tests and due to her symptoms  I have placed her on Tamiflu as noted, as well as Tessalon and Sudafed.  Flu like symptoms LSOM  Covid test is pending and we will inform her if positive. If so, was told she needs to quarantine for 5 days and if she needs extended note may come back for that.    Final Clinical Impressions(s) / UC Diagnoses   Final diagnoses:  Flu-like symptoms  Non-recurrent acute serous otitis media of left ear     Discharge Instructions      Tamiflu es lara influenza Tessalon( benzonate) para la toz Sudafed para el fluido en tu oido     ED Prescriptions     Medication Sig Dispense Auth. Provider   oseltamivir (TAMIFLU) 75 MG capsule Take 1 capsule (75 mg total) by mouth every 12 (twelve) hours. 10 capsule Rodriguez-Southworth,  Sunday Spillers, PA-C   pseudoephedrine (SUDAFED) 120 MG 12 hr tablet Take 1 tablet (120 mg total) by mouth 2 (two) times daily. 20 tablet Rodriguez-Southworth, Sunday Spillers, PA-C   benzonatate (TESSALON) 200 MG capsule Take 1 capsule (200  mg total) by mouth 3 (three) times daily as needed for cough. 30 capsule Rodriguez-Southworth, Sunday Spillers, PA-C      PDMP not reviewed this encounter.   Shelby Mattocks, [place] 02/08/23 1635

## 2023-02-08 NOTE — Discharge Instructions (Signed)
Tamiflu es lara influenza Tessalon( benzonate) para la toz Sudafed para el fluido en tu oido

## 2023-02-08 NOTE — ED Triage Notes (Signed)
Patient c/o fever, headache, and left ear pain x 2 days. Patient also reports intermittent dizziness.  Patient states she has taken Dayquil/Nyquil, Advil, and Tylenol.  Patient states she took Advil 400 mg at 1200 today.

## 2023-02-09 LAB — SARS CORONAVIRUS 2 (TAT 6-24 HRS): SARS Coronavirus 2: POSITIVE — AB

## 2023-02-28 ENCOUNTER — Ambulatory Visit (HOSPITAL_COMMUNITY)
Admission: EM | Admit: 2023-02-28 | Discharge: 2023-02-28 | Disposition: A | Discharge status: Home / Self Care | Payer: Self-pay | Attending: Internal Medicine | Admitting: Internal Medicine

## 2023-02-28 ENCOUNTER — Encounter (HOSPITAL_COMMUNITY): Payer: Self-pay

## 2023-02-28 DIAGNOSIS — Z113 Encounter for screening for infections with a predominantly sexual mode of transmission: Secondary | ICD-10-CM

## 2023-02-28 DIAGNOSIS — K1370 Unspecified lesions of oral mucosa: Secondary | ICD-10-CM | POA: Insufficient documentation

## 2023-02-28 DIAGNOSIS — R300 Dysuria: Secondary | ICD-10-CM | POA: Insufficient documentation

## 2023-02-28 LAB — POCT URINALYSIS DIPSTICK, ED / UC
Bilirubin Urine: NEGATIVE
Glucose, UA: NEGATIVE mg/dL
Ketones, ur: NEGATIVE mg/dL
Nitrite: NEGATIVE
Protein, ur: NEGATIVE mg/dL
Specific Gravity, Urine: 1.02 (ref 1.005–1.030)
Urobilinogen, UA: 1 mg/dL (ref 0.0–1.0)
pH: 7 (ref 5.0–8.0)

## 2023-02-28 LAB — POC URINE PREG, ED: Preg Test, Ur: NEGATIVE

## 2023-02-28 LAB — HIV ANTIBODY (ROUTINE TESTING W REFLEX): HIV Screen 4th Generation wRfx: NONREACTIVE

## 2023-02-28 MED ORDER — NORELGESTROMIN-ETH ESTRADIOL 150-35 MCG/24HR TD PTWK: 1.0000 | MEDICATED_PATCH | TRANSDERMAL | 12 refills | Status: DC

## 2023-02-28 MED ORDER — CEPHALEXIN 500 MG PO CAPS: 500.0000 mg | ORAL_CAPSULE | Freq: Two times a day (BID) | ORAL | 0 refills | Status: AC

## 2023-02-28 NOTE — Discharge Instructions (Addendum)
You will get a call if tests are positive, you will not get a call if tests are negative but you can check results in MyChart if you have a MyChart account.    Try the Health Dept for birth control patches.   Info from the [facility] Department website:  Patients ages 85 and younger as well as most full-time college students may receive services at no charge (free). We also accept Medicaid, [employer] and UnitedHealthcare for family planning services provided in our clinics. Patients with other insurance plans will need to pay for their visit in full at the time of service. A claim will be filed on the patient's behalf and if payment is received, the patient will be receive a refund. Patients who do not have insurance and are not eligible for Medicaid may purchase services at a reduced rate based on income and number of dependents. Patients will be asked to provide proof of income to determine cost of services. If you have questions about our clinic services, call the [facility] at [phone] for more information.  Many women and men are eligible for [employer] (also called Family Planning Medicaid Waiver). This provides Family Planning medical services as well as most contraceptives at no cost to the individual. Eligible recipients are ages 18 to 21 (for women) and 48 to 8 (for men.) Recipients must be [place] citizens or documented immigrants, residents of [place], and not incarcerated, pregnant, or permanently sterilized. Individuals must also meet certain financial requirements. For more information about Be Smart, call [phone]. You will be transferred to an eligibility specialist who can answer your questions and help you complete the application.  Our Department provides a later clinic on Tuesday evenings, with the last appointment time being 5:30 pm. Please call [phone] to schedule an appointment in either our  [facility] or [employer] office.

## 2023-02-28 NOTE — ED Triage Notes (Signed)
Pt is here for painful urination x 2days . Pt has tried AZO'S but, stated it did not help.

## 2023-02-28 NOTE — ED Provider Notes (Signed)
[place]    CSN: [id number] Arrival date & time: 02/28/23  1040      History   Chief Complaint Chief Complaint  Patient presents with   Dysuria    HPI Meredith Sullivan is a 22 y.o. female. Pt with multiple concerns. Has had dysuria for last 2 days somewhat improved with AZO and with some medication her aunt gave her (she doesn't remember what it is). Burning sensation in low abd and urethra with urination only. Otherwise doesn't have pain. Denies n/v. No fever or chills. Has seen a little blood in her urine.   LMP 3 weeks ago. Is sexually active with her boyfriend, does not use condoms or any form of birth control. Denies vaginal discharge, vaginal irritation, or any sores or lesions. Is worried about STIs, wants testing.   Requests rx for birth control patches. Has never used contraception in the past but when she was treated for her ectopic pregnancy, and they discussed options with her, she was most interested in the patch.   Also complains of small lump in inner lower lip that has been there for several weeks and is not going away. Nontender. Sometimes gets a little larger and then shrinks again   Dysuria   Past Medical History:  Diagnosis Date   Medical history non-contributory     Patient Active Problem List   Diagnosis Date Noted   Right tubal pregnancy without intrauterine pregnancy     Past Surgical History:  Procedure Laterality Date   LAPAROSCOPIC UNILATERAL SALPINGECTOMY Right 03/20/2022   Procedure: LAPAROSCOPIC RIGHT SALPINGECTOMY WITH REMOVAL OF ECTOPIC PREGNANCY;  Surgeon: Mora Bellman, MD;  Location: [place];  Service: Gynecology;  Laterality: Right;   NO PAST SURGERIES      OB History     Gravida  2   Para      Term      Preterm      AB  1   Living         SAB      IAB  1   Ectopic      Multiple      Live Births               Home Medications    Prior to Admission medications   Medication Sig Start  Date End Date Taking? Authorizing Provider  cephALEXin (KEFLEX) 500 MG capsule Take 1 capsule (500 mg total) by mouth 2 (two) times daily for 7 days. 02/28/23 03/07/23 Yes Carvel Getting, NP  norelgestromin-ethinyl estradiol (ZAFEMY) 150-35 MCG/24HR transdermal patch Place 1 patch onto the skin once a week. 02/28/23  Yes Carvel Getting, NP    Family History Family History  Problem Relation Age of Onset   Healthy Mother    Healthy Father     Social History Social History   Tobacco Use   Smoking status: Never   Smokeless tobacco: Never  Vaping Use   Vaping Use: Some days   Substances: Flavoring  Substance Use Topics   Alcohol use: Yes   Drug use: Never    Comment: Last used this past Saturday 03/20/2022     Allergies   Patient has no known allergies.   Review of Systems Review of Systems  Genitourinary:  Positive for dysuria.     Physical Exam Triage Vital Signs ED Triage Vitals  Enc Vitals Group     BP 02/28/23 1301 108/72     Pulse Rate 02/28/23 1301 70  Resp 02/28/23 1301 16     Temp 02/28/23 1301 98 F (36.7 C)     Temp Source 02/28/23 1301 Oral     SpO2 02/28/23 1301 98 %     Weight --      Height --      Head Circumference --      Peak Flow --      Pain Score 02/28/23 1257 8     Pain Loc --      Pain Edu? --      Excl. in [place]? --    No data found.  Updated Vital Signs BP 108/72 (BP Location: Left Arm)   Pulse 70   Temp 98 F (36.7 C) (Oral)   Resp 16   LMP 02/07/2023 Comment: vaginal bleeding after ectopic, finished 4/20 or 4/21  SpO2 98%   Visual Acuity Right Eye Distance:   Left Eye Distance:   Bilateral Distance:    Right Eye Near:   Left Eye Near:    Bilateral Near:     Physical Exam Constitutional:      General: She is not in acute distress.    Appearance: Normal appearance. She is not ill-appearing.  HENT:     Mouth/Throat:     Mouth: Oral lesions present.     Comments: Small ~23mm firm nodule inner lower lip toward the  midline; tissue appears white at the top of the nodule. Nontender to palpation. No associated erythema or edema.  Abdominal:     General: Abdomen is flat. There is no distension.     Palpations: Abdomen is soft.     Tenderness: There is abdominal tenderness in the suprapubic area. There is no right CVA tenderness, left CVA tenderness, guarding or rebound.     Comments: Mild tender to palp suprapubic area  Neurological:     Mental Status: She is alert.      UC Treatments / Results  Labs (all labs ordered are listed, but only abnormal results are displayed) Labs Reviewed  POCT URINALYSIS DIPSTICK, ED / UC - Abnormal; Notable for the following components:      Result Value   Hgb urine dipstick TRACE (*)    Leukocytes,Ua TRACE (*)    All other components within normal limits  HIV ANTIBODY (ROUTINE TESTING W REFLEX)  RPR  POC URINE PREG, ED  CERVICOVAGINAL ANCILLARY ONLY    EKG   Radiology No results found.  Procedures Procedures (including critical care time)  Medications Ordered in UC Medications - No data to display  Initial Impression / Assessment and Plan / UC Course  I have reviewed the triage vital signs and the nursing notes.  Pertinent labs & imaging results that were available during my care of the patient were reviewed by me and considered in my medical decision making (see chart for details).    Pt reports she rarely smokes, mostly vapes and does not do it often.   Although UA not impressive for UTI, her sx are consistetn with UTI. Rx cephalexin  Rx birth control patch but pt does not have insurance and I am concerned about their affordability. Provided pt with info about health dept services.   Discussed lump in inner lower lip with Dr. Aram Candela - most likely cyst. Discussed with pt to pursue care from an oral surgeon if it is bothersome or grows larger  Final Clinical Impressions(s) / UC Diagnoses   Final diagnoses:  Dysuria  Mouth lesion      Discharge Instructions  You will get a call if tests are positive, you will not get a call if tests are negative but you can check results in MyChart if you have a MyChart account.    Try the Health Dept for birth control patches.   Info from the [facility] Department website:  Patients ages 53 and younger as well as most full-time college students may receive services at no charge (free). We also accept Medicaid, [employer] and UnitedHealthcare for family planning services provided in our clinics. Patients with other insurance plans will need to pay for their visit in full at the time of service. A claim will be filed on the patient's behalf and if payment is received, the patient will be receive a refund. Patients who do not have insurance and are not eligible for Medicaid may purchase services at a reduced rate based on income and number of dependents. Patients will be asked to provide proof of income to determine cost of services. If you have questions about our clinic services, call the [facility] at [phone] for more information.  Many women and men are eligible for [employer] (also called Family Planning Medicaid Waiver). This provides Family Planning medical services as well as most contraceptives at no cost to the individual. Eligible recipients are ages 34 to 72 (for women) and 95 to 80 (for men.) Recipients must be [place] citizens or documented immigrants, residents of [place], and not incarcerated, pregnant, or permanently sterilized. Individuals must also meet certain financial requirements. For more information about Be Smart, call [phone]. You will be transferred to an eligibility specialist who can answer your questions and help you complete the application.  Our Department provides a later clinic on Tuesday evenings, with the last appointment time being 5:30 pm. Please call [phone] to  schedule an appointment in either our [facility] or [employer] office.     ED Prescriptions     Medication Sig Dispense Auth. Provider   norelgestromin-ethinyl estradiol (ZAFEMY) 150-35 MCG/24HR transdermal patch Place 1 patch onto the skin once a week. 3 patch Carvel Getting, NP   cephALEXin (KEFLEX) 500 MG capsule Take 1 capsule (500 mg total) by mouth 2 (two) times daily for 7 days. 14 capsule Carvel Getting, NP      PDMP not reviewed this encounter.   Carvel Getting, NP 02/28/23 1504

## 2023-03-01 LAB — CERVICOVAGINAL ANCILLARY ONLY
Bacterial Vaginitis (gardnerella): NEGATIVE
Candida Glabrata: NEGATIVE
Candida Vaginitis: NEGATIVE
Chlamydia: NEGATIVE
Comment: NEGATIVE
Comment: NEGATIVE
Comment: NEGATIVE
Comment: NEGATIVE
Comment: NEGATIVE
Comment: NORMAL
Neisseria Gonorrhea: NEGATIVE
Trichomonas: NEGATIVE

## 2023-03-01 LAB — RPR: RPR Ser Ql: NONREACTIVE

## 2023-05-23 ENCOUNTER — Encounter (HOSPITAL_COMMUNITY): Payer: Self-pay

## 2023-05-23 ENCOUNTER — Ambulatory Visit (HOSPITAL_COMMUNITY)
Admission: EM | Admit: 2023-05-23 | Discharge: 2023-05-23 | Disposition: A | Discharge status: Home / Self Care | Payer: BC Managed Care – PPO | Attending: Family Medicine | Admitting: Family Medicine

## 2023-05-23 DIAGNOSIS — R1032 Left lower quadrant pain: Secondary | ICD-10-CM | POA: Insufficient documentation

## 2023-05-23 DIAGNOSIS — R1031 Right lower quadrant pain: Secondary | ICD-10-CM | POA: Insufficient documentation

## 2023-05-23 DIAGNOSIS — N898 Other specified noninflammatory disorders of vagina: Secondary | ICD-10-CM | POA: Insufficient documentation

## 2023-05-23 DIAGNOSIS — N939 Abnormal uterine and vaginal bleeding, unspecified: Secondary | ICD-10-CM | POA: Diagnosis not present

## 2023-05-23 LAB — CBC WITH DIFFERENTIAL/PLATELET
Abs Immature Granulocytes: 0.01 10*3/uL (ref 0.00–0.07)
Basophils Absolute: 0 10*3/uL (ref 0.0–0.1)
Basophils Relative: 1 %
Eosinophils Absolute: 0.2 10*3/uL (ref 0.0–0.5)
Eosinophils Relative: 2 %
HCT: 41 % (ref 36.0–46.0)
Hemoglobin: 14 g/dL (ref 12.0–15.0)
Immature Granulocytes: 0 %
Lymphocytes Relative: 46 %
Lymphs Abs: 3.2 10*3/uL (ref 0.7–4.0)
MCH: 31 pg (ref 26.0–34.0)
MCHC: 34.1 g/dL (ref 30.0–36.0)
MCV: 90.7 fL (ref 80.0–100.0)
Monocytes Absolute: 0.6 10*3/uL (ref 0.1–1.0)
Monocytes Relative: 9 %
Neutro Abs: 3 10*3/uL (ref 1.7–7.7)
Neutrophils Relative %: 42 %
Platelets: 227 10*3/uL (ref 150–400)
RBC: 4.52 MIL/uL (ref 3.87–5.11)
RDW: 13.8 % (ref 11.5–15.5)
WBC: 7 10*3/uL (ref 4.0–10.5)
nRBC: 0 % (ref 0.0–0.2)

## 2023-05-23 LAB — COMPREHENSIVE METABOLIC PANEL
ALT: 15 U/L (ref 0–44)
AST: 16 U/L (ref 15–41)
Albumin: 4.1 g/dL (ref 3.5–5.0)
Alkaline Phosphatase: 56 U/L (ref 38–126)
Anion gap: 8 (ref 5–15)
BUN: 9 mg/dL (ref 6–20)
CO2: 26 mmol/L (ref 22–32)
Calcium: 9.3 mg/dL (ref 8.9–10.3)
Chloride: 105 mmol/L (ref 98–111)
Creatinine, Ser: 0.67 mg/dL (ref 0.44–1.00)
GFR, Estimated: >60 mL/min (ref >60)
Glucose, Bld: 96 mg/dL (ref 70–99)
Potassium: 4.2 mmol/L (ref 3.5–5.1)
Sodium: 139 mmol/L (ref 135–145)
Total Bilirubin: 0.5 mg/dL (ref 0.3–1.2)
Total Protein: 7.2 g/dL (ref 6.5–8.1)

## 2023-05-23 LAB — POCT URINALYSIS DIP (MANUAL ENTRY)
Bilirubin, UA: NEGATIVE
Glucose, UA: NEGATIVE mg/dL
Ketones, POC UA: NEGATIVE mg/dL
Leukocytes, UA: NEGATIVE
Nitrite, UA: NEGATIVE
Protein Ur, POC: NEGATIVE mg/dL
Spec Grav, UA: 1.025 (ref 1.010–1.025)
Urobilinogen, UA: 0.2 E.U./dL
pH, UA: 6 (ref 5.0–8.0)

## 2023-05-23 LAB — POCT URINE PREGNANCY: Preg Test, Ur: NEGATIVE

## 2023-05-23 LAB — LIPASE, BLOOD: Lipase: 29 U/L (ref 11–51)

## 2023-05-23 MED ORDER — FLUCONAZOLE 150 MG PO TABS: ORAL_TABLET | ORAL | 0 refills | Status: DC

## 2023-05-23 MED ORDER — MEGESTROL ACETATE 40 MG PO TABS: 40.0000 mg | ORAL_TABLET | Freq: Every day | ORAL | 0 refills | Status: AC

## 2023-05-23 NOTE — ED Provider Notes (Signed)
[facility]   [id number] 05/23/23 Arrival Time: 0935  ASSESSMENT & PLAN:  1. Abnormal vaginal bleeding   2. Bilateral lower abdominal discomfort   3. Vaginal itching    Unclear etiology of AUB. Benign abdominal exam. No indications for urgent abdominal/pelvic imaging at this time.   Pending: Labs Reviewed  LIPASE, BLOOD  CBC WITH DIFFERENTIAL/PLATELET  COMPREHENSIVE METABOLIC PANEL  CERVICOVAGINAL ANCILLARY ONLY   Begin empiric treatment for presumed vaginal yeast infection. Meds ordered this encounter  Medications   fluconazole (DIFLUCAN) 150 MG tablet    Sig: Take one tablet by mouth as a single dose. May repeat in 3 days if symptoms persist.    Dispense:  2 tablet    Refill:  0   Until f/u with GYN:  Medications   megestrol (MEGACE) 40 MG tablet    Sig: Take 1 tablet (40 mg total) by mouth daily.    Dispense:  30 tablet    Refill:  0   No suspicion for PID.   Follow-up Information     Schedule an appointment as soon as possible for a visit  with [facility].   Specialty: Obstetrics and Gynecology Contact information: [address] [place] [address] [phone]        [facility] Emergency Department at [facility].   Specialty: Emergency Medicine Why: If symptoms worsen in any way. Contact information: [address] [id number] mc [place] [address] [phone]                 Discharge Instructions      You have been seen today for abdominal pain. Your evaluation was not suggestive of any emergent condition requiring medical intervention at this time. However, some abdominal problems make take more time to appear. Therefore, it is very important for you to pay attention to any new symptoms or worsening of your current condition.  Please return here or to the Emergency Department immediately should you begin to feel worse in  any way or have any of the following symptoms: increasing or different abdominal pain, persistent vomiting, inability to drink fluids, fevers, or shaking chills.   In addition to blood tests, we have sent testing for sexually transmitted infections. We will notify you of any positive results once they are received. If required, we will prescribe any medications you might need.  Please refrain from all sexual activity for at least the next seven days.     Reviewed expectations re: course of current medical issues. Questions answered. Outlined signs and symptoms indicating need for more acute intervention. Patient verbalized understanding. After Visit Summary given.   SUBJECTIVE: History from: patient. Meredith Sullivan is a 22 y.o. female who presents with complaint of on/off menstrual bleeding; past few weeks with 3 light what feels like her menstrual period. No h/o similar. Did take a Plan B last week. Mild lower intermittent abd discomfort. Denies fever/n/v/back pain. Normal urinary/bowel habits. Sexually active with one female partner. Does report mild vaginal itching over past week or two. No tx PTA.  Past Surgical History:  Procedure Laterality Date   LAPAROSCOPIC UNILATERAL SALPINGECTOMY Right 03/20/2022   Procedure: LAPAROSCOPIC RIGHT SALPINGECTOMY WITH REMOVAL OF ECTOPIC PREGNANCY;  Surgeon: Catalina Antigua, MD;  Location: [facility] OR;  Service: Gynecology;  Laterality: Right;   NO PAST SURGERIES     OBJECTIVE:  Vitals:   05/23/23 0949  BP: 104/72  Pulse: (!) 59  Resp: 18  Temp: 98.7 F (37.1 C)  TempSrc: Oral  SpO2: 98%    General appearance: alert, oriented, no acute distress HEENT: [place]; AT; oropharynx moist Lungs: unlabored respirations Abdomen: soft; without distention; no specific tenderness to palpation; normal bowel sounds; without masses or organomegaly; without guarding or rebound tenderness Pelvic: declined Back: without reported CVA tenderness; FROM at  waist Extremities: without LE edema; symmetrical; without gross deformities Skin: warm and dry Neurologic: normal gait Psychological: alert and cooperative; normal mood and affect  Labs: Results for orders placed or performed during the hospital encounter of 05/23/23  POCT urine pregnancy  Result Value Ref Range   Preg Test, Ur Negative Negative  POC urinalysis dipstick  Result Value Ref Range   Color, UA yellow yellow   Clarity, UA clear clear   Glucose, UA negative negative mg/dL   Bilirubin, UA negative negative   Ketones, POC UA negative negative mg/dL   Spec Grav, UA 1.610 9.604 - 1.025   Blood, UA trace-lysed (A) negative   pH, UA 6.0 5.0 - 8.0   Protein Ur, POC negative negative mg/dL   Urobilinogen, UA 0.2 0.2 or 1.0 E.U./dL   Nitrite, UA Negative Negative   Leukocytes, UA Negative Negative   Labs Reviewed  POCT URINALYSIS DIP (MANUAL ENTRY) - Abnormal; Notable for the following components:      Result Value   Blood, UA trace-lysed (*)    All other components within normal limits  LIPASE, BLOOD  CBC WITH DIFFERENTIAL/PLATELET  COMPREHENSIVE METABOLIC PANEL  POCT URINE PREGNANCY  CERVICOVAGINAL ANCILLARY ONLY    No Known Allergies                                             Past Medical History:  Diagnosis Date   Medical history non-contributory     Social History   Socioeconomic History   Marital status: Single    Spouse name: Not on file   Number of children: Not on file   Years of education: Not on file   Highest education level: Not on file  Occupational History   Not on file  Tobacco Use   Smoking status: Never   Smokeless tobacco: Never  Vaping Use   Vaping Use: Some days   Substances: Flavoring  Substance and Sexual Activity   Alcohol use: Yes   Drug use: Never    Comment: Last used this past Saturday 03/20/2022   Sexual activity: Yes    Birth control/protection: None  Other Topics Concern   Not on file  Social History Narrative   Not  on file   Social Determinants of Health   Financial Resource Strain: Not on file  Food Insecurity: Not on file  Transportation Needs: Not on file  Physical Activity: Not on file  Stress: Not on file  Social Connections: Not on file  Intimate Partner Violence: Not on file    Family History  Problem Relation Age of Onset   Healthy Mother    Healthy Father      Mardella Layman, MD 05/23/23 1050

## 2023-05-23 NOTE — Discharge Instructions (Signed)
You have been seen today for abdominal pain. Your evaluation was not suggestive of any emergent condition requiring medical intervention at this time. However, some abdominal problems make take more time to appear. Therefore, it is very important for you to pay attention to any new symptoms or worsening of your current condition.  Please return here or to the Emergency Department immediately should you begin to feel worse in any way or have any of the following symptoms: increasing or different abdominal pain, persistent vomiting, inability to drink fluids, fevers, or shaking chills.   In addition to blood tests, we have sent testing for sexually transmitted infections. We will notify you of any positive results once they are received. If required, we will prescribe any medications you might need.  Please refrain from all sexual activity for at least the next seven days.

## 2023-05-23 NOTE — ED Triage Notes (Addendum)
Pt presents with abnormal menses cycle. Pt stated she took a Plan B pill last week. Pt reports she had 3 menstrual cycle.  Pt reports vaginal discharge and itching.

## 2023-05-24 LAB — CERVICOVAGINAL ANCILLARY ONLY
Bacterial Vaginitis (gardnerella): POSITIVE — AB
Candida Glabrata: NEGATIVE
Candida Vaginitis: POSITIVE — AB
Chlamydia: NEGATIVE
Comment: NEGATIVE
Comment: NEGATIVE
Comment: NEGATIVE
Comment: NEGATIVE
Comment: NEGATIVE
Comment: NORMAL
Neisseria Gonorrhea: NEGATIVE
Trichomonas: NEGATIVE

## 2023-05-25 ENCOUNTER — Telehealth (HOSPITAL_COMMUNITY): Payer: Self-pay | Admitting: Emergency Medicine

## 2023-05-25 MED ORDER — METRONIDAZOLE 500 MG PO TABS: 500.0000 mg | ORAL_TABLET | Freq: Two times a day (BID) | ORAL | 0 refills | Status: DC

## 2023-05-28 ENCOUNTER — Encounter: Payer: Self-pay | Admitting: Student

## 2023-05-28 ENCOUNTER — Ambulatory Visit (INDEPENDENT_AMBULATORY_CARE_PROVIDER_SITE_OTHER): Payer: BC Managed Care – PPO | Admitting: Student

## 2023-05-28 VITALS — BP 98/65 | HR 66 | Ht 64.0 in | Wt 165.6 lb

## 2023-05-28 DIAGNOSIS — R1030 Lower abdominal pain, unspecified: Secondary | ICD-10-CM | POA: Diagnosis not present

## 2023-05-28 DIAGNOSIS — R102 Pelvic and perineal pain: Secondary | ICD-10-CM

## 2023-05-28 DIAGNOSIS — Z3009 Encounter for other general counseling and advice on contraception: Secondary | ICD-10-CM

## 2023-05-28 NOTE — Progress Notes (Signed)
CC: Pelvic pain x 1-2 weeks or so was seen 05/23/23 at [facility] and medication sent in and also had period 2 times this month

## 2023-05-28 NOTE — Progress Notes (Signed)
  History:  [name] is a 22 y.o. G2P0020 who presents to clinic today for intermittent lower abdominal/pelvic pain. Patient presented to ED 5 days ago for similar symptoms along with vaginal bleeding. Patient was informed of BV and Yeast infections and negative gonorrhea/chlamydia . Patient started her treatment plan today. Patient is no longer having vaginal bleeding. Patient states that she had 3 short cycles last month but thinks it is related to taking emergency contraception x2 in same month. Patient had negative pregnancy test at ED visit. Denies any fever, headaches, n/v, worsening pain, vaginal discharge or bleeding.   Otherwise states that she has regular periods that come every 28-32 days, last x4-5 days with heaviest bleeding only on day 1&2. Uses pads and changes them every ~4 hours during cycles. Does not have concerns related to periods but is interested in discussing birth control.   The following portions of the patient's history were reviewed and updated as appropriate: allergies, current medications, family history, past medical history, social history, past surgical history and problem list.  Review of Systems:  ROS    Objective:  Physical Exam BP 98/65   Pulse 66   Ht 5\' 4"  (1.626 m)   Wt 165 lb 9.6 oz (75.1 kg)   LMP 05/22/2023 (Approximate) Comment: vaginal bleeding after ectopic, finished 4/20 or 4/21  BMI 28.43 kg/m  Physical Exam    Labs and Imaging No results found for this or any previous visit (from the past 24 hour(s)).  No results found.  Health Maintenance Due  Topic Date Due   COVID-19 Vaccine (1) Never done   HPV VACCINES (1 - 2-dose series) Never done   Hepatitis C Screening  Never done   DTaP/Tdap/Td (1 - Tdap) Never done   PAP-Cervical Cytology Screening  Never done   PAP SMEAR-Modifier  Never done    Labs, imaging and previous visits in Epic and Care Everywhere reviewed  Assessment & Plan:  1. Lower abdominal pain 2.  Pelvic pain - Reviewed results and management plan from ED visit - Reassurance provided that irregular bleeding can occur with Emergency Contraceptives    3. General counseling and advice for contraceptive management Reviewed Rx. forms of birth control options available including hormonal contraceptive medication including pill, patch, ring, injection,contraceptive implant; hormonal and nonhormonal IUDs;  Risks and benefits reviewed.  Questions were answered.  Information was offered to patient to review. Is considering contraceptive patch, but is not decided today. Will follow-up at annual visit.   Plan for annual in 2 weeks and  repeat infectious testing if still symptomatic. Discussed consideration of TVUS if symptoms continue after treatment. Precautions provided for when patient would need to seek more urgent follow-up care such as fever, worsening or severe abdominal pain, onset of nausea/vomiting, or abnormal vaginal bleeding.  Approximately 25 minutes of total time was spent with this patient on reviewing previous results and coordinating follow-up care plan and counseling.  Return in about 2 weeks (around 06/11/2023) for ANN and birth control consult.  Corlis Hove, NP 05/28/2023 6:15 PM

## 2023-07-16 ENCOUNTER — Ambulatory Visit (INDEPENDENT_AMBULATORY_CARE_PROVIDER_SITE_OTHER): Payer: BC Managed Care – PPO | Admitting: Obstetrics and Gynecology

## 2023-07-16 ENCOUNTER — Encounter: Payer: Self-pay | Admitting: Obstetrics and Gynecology

## 2023-07-16 ENCOUNTER — Other Ambulatory Visit (HOSPITAL_COMMUNITY)
Admission: RE | Admit: 2023-07-16 | Discharge: 2023-07-16 | Disposition: A | Discharge status: Home / Self Care | Payer: BC Managed Care – PPO | Source: Ambulatory Visit | Attending: Obstetrics and Gynecology | Admitting: Obstetrics and Gynecology

## 2023-07-16 VITALS — BP 104/68 | HR 61 | Ht 64.0 in | Wt 164.8 lb

## 2023-07-16 DIAGNOSIS — Z3009 Encounter for other general counseling and advice on contraception: Secondary | ICD-10-CM

## 2023-07-16 DIAGNOSIS — Z01419 Encounter for gynecological examination (general) (routine) without abnormal findings: Secondary | ICD-10-CM | POA: Diagnosis present

## 2023-07-16 DIAGNOSIS — Z124 Encounter for screening for malignant neoplasm of cervix: Secondary | ICD-10-CM

## 2023-07-16 DIAGNOSIS — N939 Abnormal uterine and vaginal bleeding, unspecified: Secondary | ICD-10-CM | POA: Diagnosis not present

## 2023-07-16 MED ORDER — NORELGESTROMIN-ETH ESTRADIOL 150-35 MCG/24HR TD PTWK: 1.0000 | MEDICATED_PATCH | TRANSDERMAL | 12 refills | Status: AC

## 2023-07-16 NOTE — Progress Notes (Unsigned)
Pt presents to discuss irregular periods for last 3 months. States they can be heavy and painful.  Interested in discussing birth control patches.   Needs pap smear.

## 2023-07-17 NOTE — Progress Notes (Signed)
   RETURN GYNECOLOGY VISIT  Subjective:  Meredith Sullivan is a 22 y.o. G2P0020 with LMP 07/09/23 presenting for discussion of AUB  Reports irregular periods x 3 months. They have typically been monthly but 3 months ago she had 2 periods in 1 month. Cycles are sometimes heavy and/or painful. Reports using plan B around once per month. Sexually active with female partner.   Menarche - 72 yo  She is interested in birth control patches. She denies any contraindications to estrogen including migraine w/ aura.   I personally reviewed the following: - CBC 05/23/23 w Hgb 14.0 - Cervicovaginal ancillary 05/23/23 negative GC/CT/trich - UPT 05/23/23 negative  Objective:   Vitals:   07/16/23 1558  BP: 104/68  Pulse: 61  Weight: 164 lb 12.8 oz (74.8 kg)  Height: 5\' 4"  (1.626 m)    General:  Alert, oriented and cooperative. Patient is in no acute distress.  Skin: Skin is warm and dry. No rash noted.   Cardiovascular: Normal heart rate noted  Respiratory: Normal respiratory effort, no problems with respiration noted  Abdomen: Soft, non-tender, non-distended   Pelvic: NEFG.   Exam performed in the presence of a chaperone  Assessment and Plan:  Remona Rajah Sera is a 22 y.o. with AUB-I  Abnormal uterine bleeding (AUB), likely iatrogenic Birth control counseling Cervical cancer screening Discussed most likely etiology of her irregular periods is use of emergency contraception.  UPT GC/CT/trich collected Pap collected We reviewed management options including COCPs (continuous & cyclic), patch, ring, progestins (continuous & cyclic), DMPA (Depo Provera), Nexplanon, and LNG-IUD (Mirena). Pt opted for birth control patch. Encouraged concomitant condom use -     POCT urine pregnancy -     Cytology - PAP( [place]) -     norelgestromin-ethinyl estradiol [employer] [facility]) 150-35 MCG/24HR transdermal patch; Place 1 patch onto the skin once a week.  Return if symptoms worsen or fail to  improve. Lennart Pall, MD

## 2023-08-01 ENCOUNTER — Encounter (HOSPITAL_BASED_OUTPATIENT_CLINIC_OR_DEPARTMENT_OTHER): Payer: Self-pay | Admitting: Emergency Medicine

## 2023-08-01 ENCOUNTER — Other Ambulatory Visit: Payer: Self-pay

## 2023-08-01 ENCOUNTER — Emergency Department (HOSPITAL_BASED_OUTPATIENT_CLINIC_OR_DEPARTMENT_OTHER)
Admission: EM | Admit: 2023-08-01 | Discharge: 2023-08-01 | Disposition: A | Discharge status: Home / Self Care | Payer: BC Managed Care – PPO | Attending: Emergency Medicine | Admitting: Emergency Medicine

## 2023-08-01 DIAGNOSIS — U071 COVID-19: Secondary | ICD-10-CM

## 2023-08-01 DIAGNOSIS — J020 Streptococcal pharyngitis: Secondary | ICD-10-CM

## 2023-08-01 DIAGNOSIS — M542 Cervicalgia: Secondary | ICD-10-CM | POA: Insufficient documentation

## 2023-08-01 DIAGNOSIS — J029 Acute pharyngitis, unspecified: Secondary | ICD-10-CM | POA: Diagnosis present

## 2023-08-01 LAB — SARS CORONAVIRUS 2 BY RT PCR: SARS Coronavirus 2 by RT PCR: POSITIVE — AB

## 2023-08-01 LAB — GROUP A STREP BY PCR: Group A Strep by PCR: DETECTED — AB

## 2023-08-01 MED ORDER — AMOXICILLIN 500 MG PO CAPS: 500.0000 mg | ORAL_CAPSULE | Freq: Two times a day (BID) | ORAL | 0 refills | Status: AC

## 2023-08-01 MED ORDER — IBUPROFEN 400 MG PO TABS
400.0000 mg | ORAL_TABLET | Freq: Once | ORAL | Status: AC | PRN
  Administered 2023-08-01: 400 mg via ORAL
  Filled 2023-08-01: qty 1

## 2023-08-01 NOTE — Discharge Instructions (Addendum)
You tested positive for both COVID-19 and strep throat.  We will treat your strep throat with a 10-day course of amoxicillin.  For your COVID, recommend supportive care with Tylenol, ibuprofen, continued oral rehydration.  Return for any severe worsening symptoms, difficulty swallowing or speaking.

## 2023-08-01 NOTE — ED Provider Notes (Signed)
[place] EMERGENCY DEPARTMENT AT [facility] Provider Note   CSN: [id number] Arrival date & time: 08/01/23  2008     History  Chief Complaint  Patient presents with   Neck Pain   Sore Throat    Meredith Sullivan is a 22 y.o. female.   Neck Pain Sore Throat     22 year old female presenting to the emergency department with 2 days of a sore throat.  She endorses neck pain, sore throat, headache, chills.  Her aunt was recently sick with an unclear illness.  She denies any difficulty with range of motion of the neck.  Endorses bilateral swelling in the neck.  No significant difficulty speaking, no tongue elevation, no difficulty breathing.  Home Medications Prior to Admission medications   Medication Sig Start Date End Date Taking? Authorizing Provider  amoxicillin (AMOXIL) 500 MG capsule Take 1 capsule (500 mg total) by mouth 2 (two) times daily for 10 days. 08/01/23 08/11/23 Yes Ernie Avena, MD  fluconazole (DIFLUCAN) 150 MG tablet Take one tablet by mouth as a single dose. May repeat in 3 days if symptoms persist. Patient not taking: Reported on 07/16/2023 05/23/23   Mardella Layman, MD  metroNIDAZOLE (FLAGYL) 500 MG tablet Take 1 tablet (500 mg total) by mouth 2 (two) times daily. Patient not taking: Reported on 07/16/2023 05/25/23   Merrilee Jansky, MD  norelgestromin-ethinyl estradiol [employer] [facility]) 150-35 MCG/24HR transdermal patch Place 1 patch onto the skin once a week. 07/16/23   Lennart Pall, MD      Allergies    Patient has no known allergies.    Review of Systems   Review of Systems  Constitutional:  Positive for chills.  HENT:  Positive for sore throat.   Musculoskeletal:  Positive for neck pain.  All other systems reviewed and are negative.   Physical Exam Updated Vital Signs BP 110/64 (BP Location: Right Arm)   Pulse 88   Temp 99.8 F (37.7 C) (Oral)   Resp 18   Wt 75 kg   LMP 07/09/2023 (Approximate)   SpO2 100%   BMI 28.38 kg/m   Physical Exam Vitals and nursing note reviewed.  Constitutional:      General: She is not in acute distress.    Appearance: She is well-developed.  HENT:     Head: Normocephalic and atraumatic.     Mouth/Throat:     Pharynx: Posterior oropharyngeal erythema present.     Tonsils: Tonsillar exudate present. No tonsillar abscesses.     Comments: Bilateral oropharyngeal erythema with tonsillar exudate.  No trismus, no tongue elevation, good range of motion of the neck, negative meningeal findings Eyes:     Conjunctiva/sclera: Conjunctivae normal.  Cardiovascular:     Rate and Rhythm: Normal rate and regular rhythm.     Heart sounds: No murmur heard. Pulmonary:     Effort: Pulmonary effort is normal. No respiratory distress.     Breath sounds: Normal breath sounds.  Abdominal:     Palpations: Abdomen is soft.     Tenderness: There is no abdominal tenderness.  Musculoskeletal:        General: No swelling.     Cervical back: Neck supple.  Skin:    General: Skin is warm and dry.     Capillary Refill: Capillary refill takes less than 2 seconds.  Neurological:     Mental Status: She is alert.  Psychiatric:        Mood and Affect: Mood normal.     ED  Results / Procedures / Treatments   Labs (all labs ordered are listed, but only abnormal results are displayed) Labs Reviewed  GROUP A STREP BY PCR - Abnormal; Notable for the following components:      Result Value   Group A Strep by PCR DETECTED (*)    All other components within normal limits  SARS CORONAVIRUS 2 BY RT PCR - Abnormal; Notable for the following components:   SARS Coronavirus 2 by RT PCR POSITIVE (*)    All other components within normal limits    EKG None  Radiology No results found.  Procedures Procedures    Medications Ordered in ED Medications  ibuprofen (ADVIL) tablet 400 mg (400 mg Oral Given 08/01/23 2037)    ED Course/ Medical Decision Making/ A&P Clinical Course as of 08/01/23 2159  Wed Aug 01, 2023  2127 SARS Coronavirus 2 by RT PCR(!): POSITIVE [JL]  2127 Group A Strep by PCR(!): DETECTED [JL]    Clinical Course User Index [JL] Ernie Avena, MD                                 Medical Decision Making Amount and/or Complexity of Data Reviewed Labs:  Decision-making details documented in ED Course.  Risk Prescription drug management.    22 year old female presenting to the emergency department with 2 days of a sore throat.  She endorses neck pain, sore throat, headache, chills.  Her aunt was recently sick with an unclear illness.  She denies any difficulty with range of motion of the neck.  Endorses bilateral swelling in the neck.  No significant difficulty speaking, no tongue elevation, no difficulty breathing.  On arrival, the patient was afebrile, hemodynamically stable, not tachycardic or tachypneic, saturating well on room air.  Physical exam significant for bilateral oropharyngeal erythema with tonsillar exudate, no focal peritonsillar abscess noted, no trismus, no evidence of Ludwig's Angina.  Strep pharyngitis versus COVID-19 versus other viral infection.  Strep PCR resulted positive in addition to COVID PCR.  The patient was informed of the diagnosis.  She was tolerating oral intake.  She had no desaturations on ambulation with oximetry.  Advised supportive care with Tylenol and ibuprofen, continued oral rehydration.  Will prescribe amoxicillin for her strep pharyngitis.  Overall stable for discharge.  Final Clinical Impression(s) / ED Diagnoses Final diagnoses:  Strep pharyngitis  COVID-19    Rx / DC Orders ED Discharge Orders          Ordered    amoxicillin (AMOXIL) 500 MG capsule  2 times daily        08/01/23 2159              Ernie Avena, MD 08/01/23 2159

## 2023-08-01 NOTE — ED Triage Notes (Signed)
Starting yesterday started experiencing midback pain that has progressed to upper back. Then last night started experiencing neck pain, sore throat, HA, chills, ear pain (B).  Aunt recently sick.   Able to touch chin to chest.   No recent falls.   No OTC meds tried at home

## 2023-08-01 NOTE — ED Notes (Signed)
Pt verbalized understanding of d/c instructions, meds, and followup care. Denies questions. VSS, no distress noted. Steady gait to exit with all belongings.  ?

## 2023-10-04 IMAGING — CT CT ABD-PELV W/ CM
2 of 4 series · 15 of 46 positions shown, 17 images · IV contrast (APPLIED)
Comparison: None.

CLINICAL DATA: Status post ectopic pregnancy removal. Abdominal
pain and chest pain.



[Series 5: abdomen 5.0 · axial · 0.75mm/px · z∈[+706,+1131]mm · 12 of 97 slices shown, 14 images]
[im 6/97  soft-tissue]
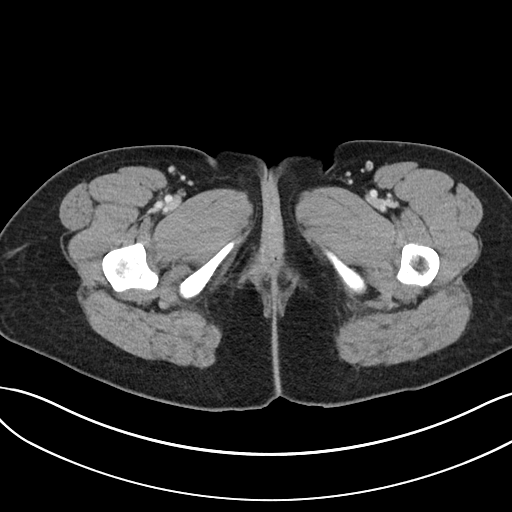
[im 6/97  bone]
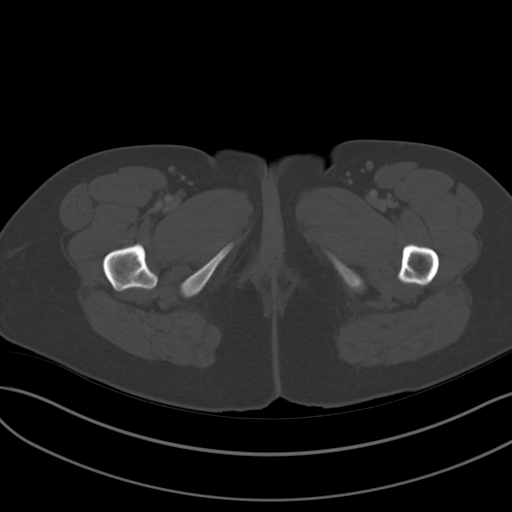
[im 16/97  soft-tissue]
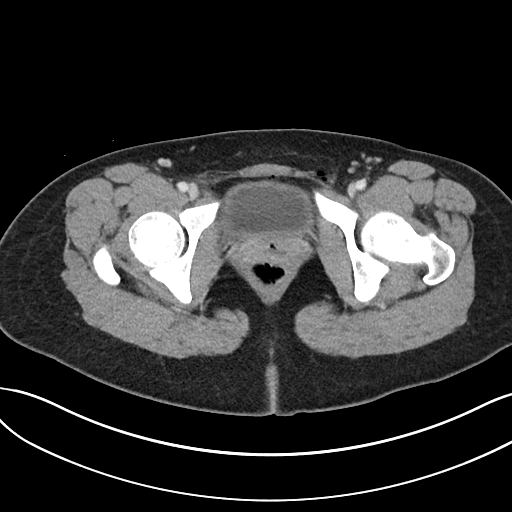
[im 21/97  soft-tissue]
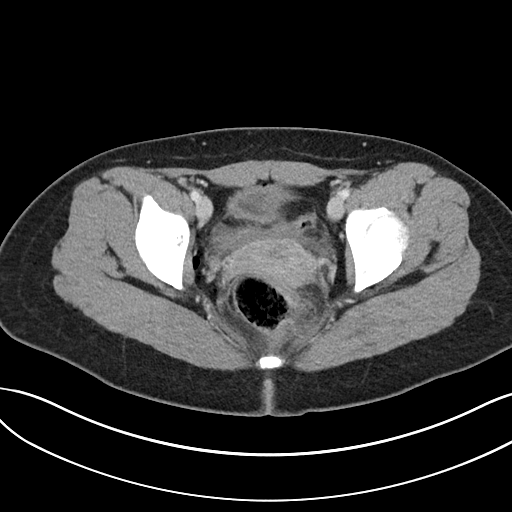
[im 31/97  soft-tissue]
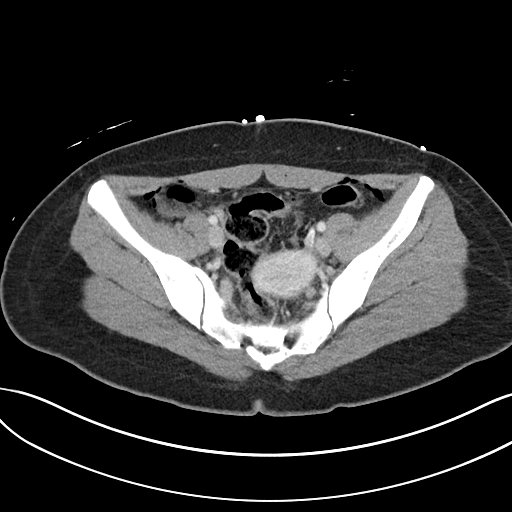
[im 36/97  soft-tissue]
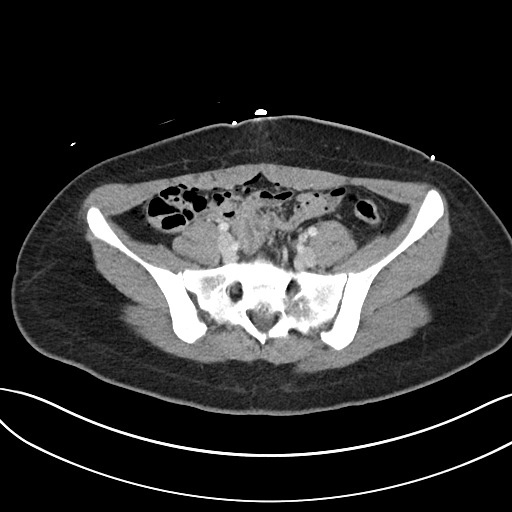
[im 46/97  soft-tissue]
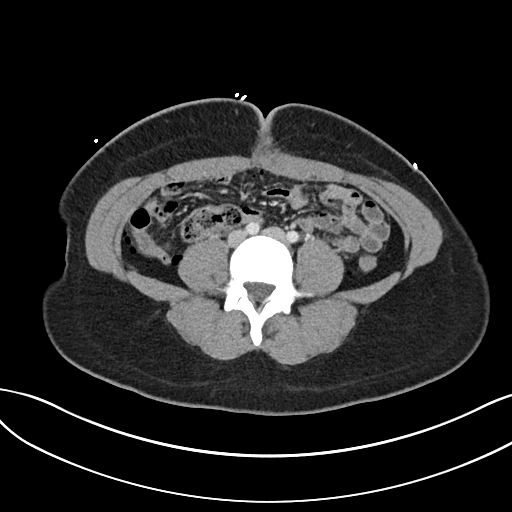
[im 51/97  soft-tissue]
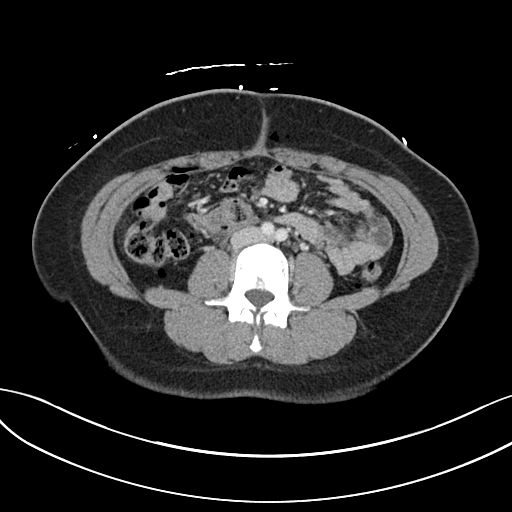
[im 61/97  soft-tissue]
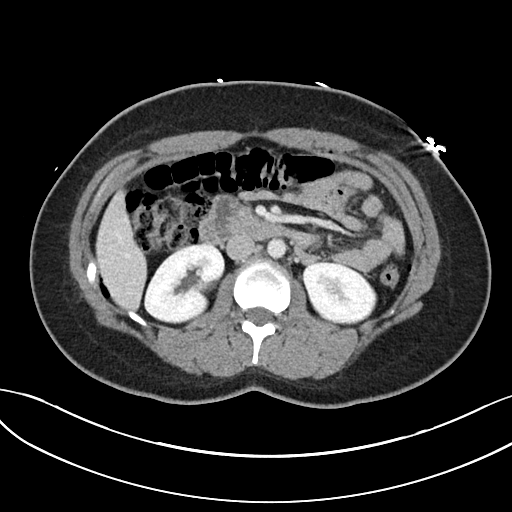
[im 66/97  soft-tissue]
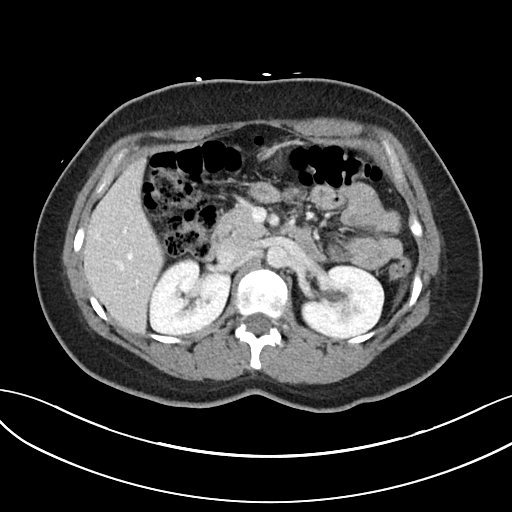
[im 66/97  bone]
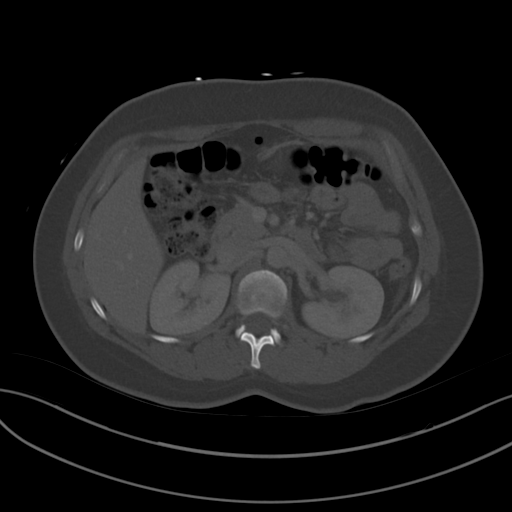
[im 76/97  soft-tissue]
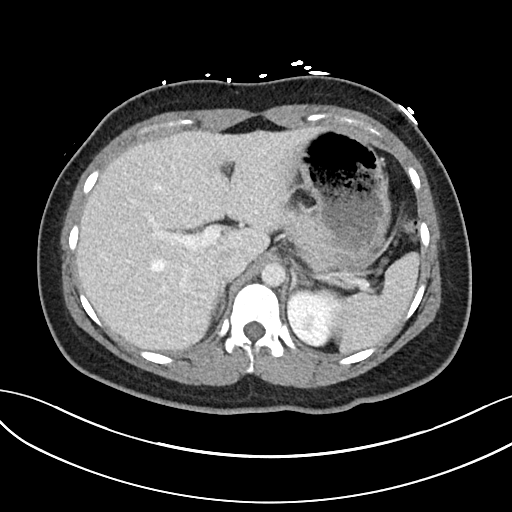
[im 81/97  soft-tissue]
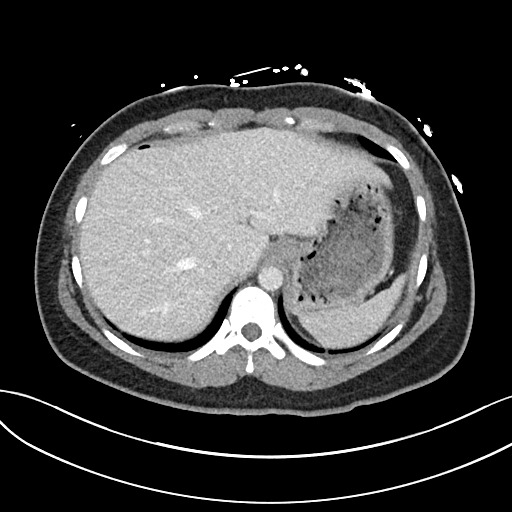
[im 91/97  soft-tissue]
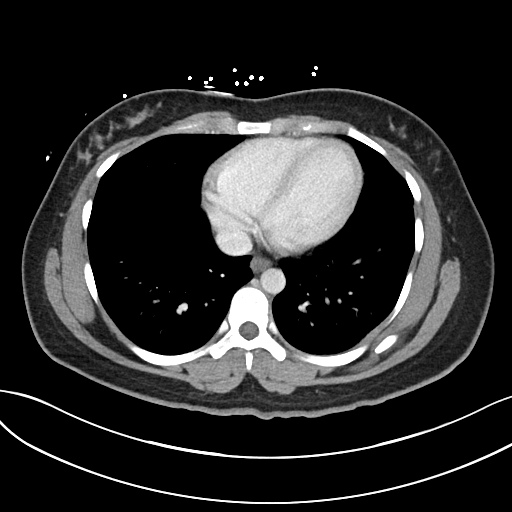

[Series 8: abdomen 3.0 mpr cor · coronal · 0.85mm/px · 3 of 90 slices shown]
[im 30/90  soft-tissue]
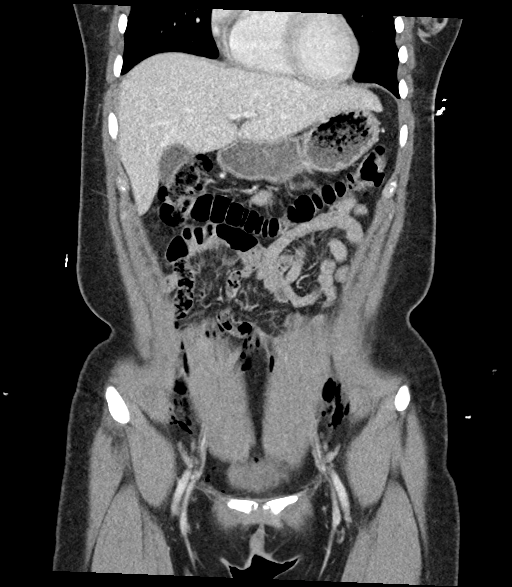
[im 40/90  soft-tissue]
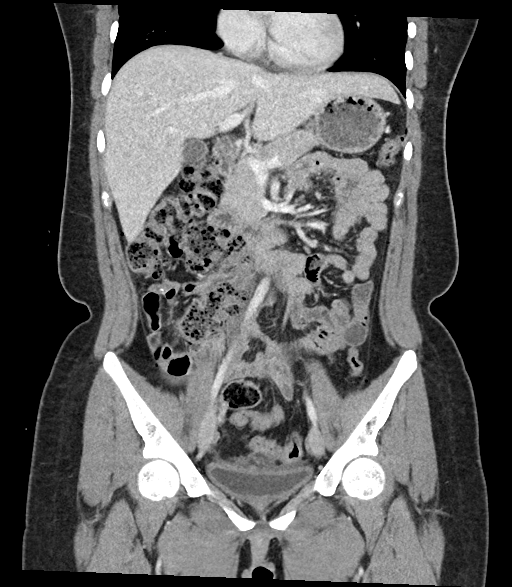
[im 50/90  soft-tissue]
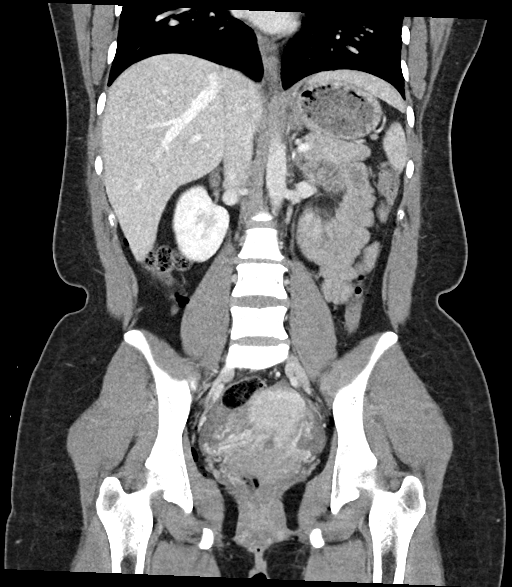

[15 of 46 positions shown; findings below may reference images not displayed]

RADIATION DOSE REDUCTION: This exam was performed according to the
departmental dose-optimization program which includes automated
exposure control, adjustment of the mA and/or kV according to
patient size and/or use of iterative reconstruction technique.

CONTRAST:  100mL OMNIPAQUE IOHEXOL 350 MG/ML SOLN
FINDINGS: CTA CHEST FINDINGS

Cardiovascular: Satisfactory opacification of the pulmonary arteries
to the segmental level. No evidence of pulmonary embolism. Normal
heart size. No pericardial effusion.

Mediastinum/Nodes: No enlarged mediastinal, hilar, or axillary lymph
nodes. Thyroid gland, trachea, and esophagus demonstrate no
significant findings.

Lungs/Pleura: Lungs are clear.  No pleural effusion or pneumothorax.

Musculoskeletal: No abdominal wall hernia identified. Small amount
of abdominal wall air is compatible with recent surgery.

Review of the MIP images confirms the above findings.

CT ABDOMEN and PELVIS FINDINGS

Hepatobiliary: No focal liver abnormality is seen. No gallstones,
gallbladder wall thickening, or biliary dilatation.

Pancreas: Unremarkable. No pancreatic ductal dilatation or
surrounding inflammatory changes.

Spleen: Normal in size without focal abnormality.

Adrenals/Urinary Tract: Adrenal glands are unremarkable. Kidneys are
normal, without renal calculi, focal lesion, or hydronephrosis.
Bladder is unremarkable.

Stomach/Bowel: Stomach is within normal limits. Appendix appears
normal. No evidence of bowel wall thickening, distention, or
inflammatory changes.

Vascular/Lymphatic: No significant vascular findings are present. No
enlarged abdominal or pelvic lymph nodes.

Reproductive: Uterus and bilateral adnexa are unremarkable.

Other: There is a small amount of free fluid in the pelvis. Small
area of hyperdensity may represent hematoma in the right adnexa
measuring 1.5 x 1.1 cm. There is a small amount of free air anomaly
in the pelvis, but also minimally in the upper abdomen compatible
with recent surgery.

Musculoskeletal: No acute or significant osseous findings.

Review of the MIP images confirms the above findings.
IMPRESSION: 1. No evidence for pulmonary embolism. No acute cardiopulmonary
process.
2. Small amount of free intraperitoneal air and air within the
abdominal wall compatible with patient's history of recent surgery.
3. Small amount of free fluid in the pelvis with likely small pelvic
hematoma in the right adnexa.

## 2023-10-04 IMAGING — CT CT ANGIO CHEST
2 of 7 series · 16 of 46 positions shown · IV contrast (APPLIED)
Comparison: None.

CLINICAL DATA: Status post ectopic pregnancy removal. Abdominal
pain and chest pain.



[Series 5: thins · axial · 0.75mm/px · z∈[+1057,+1297]mm · 13 of 386 slices shown]
[im 22/386  lung]
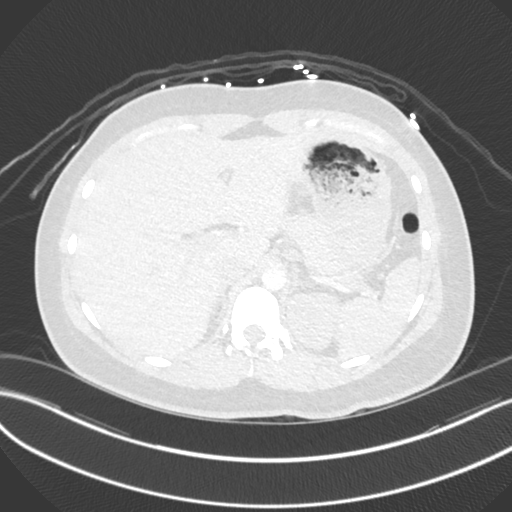
[im 43/386  soft-tissue]
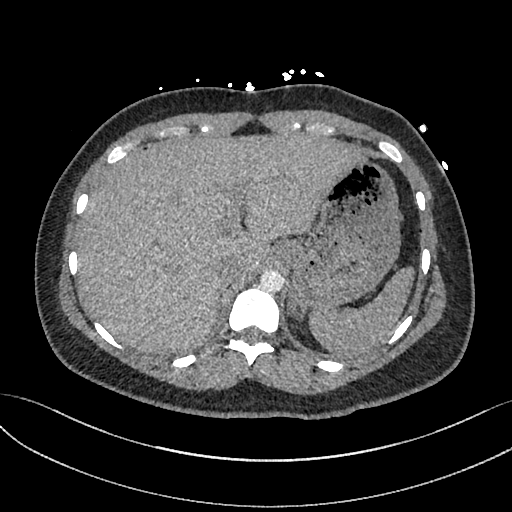
[im 86/386  lung]
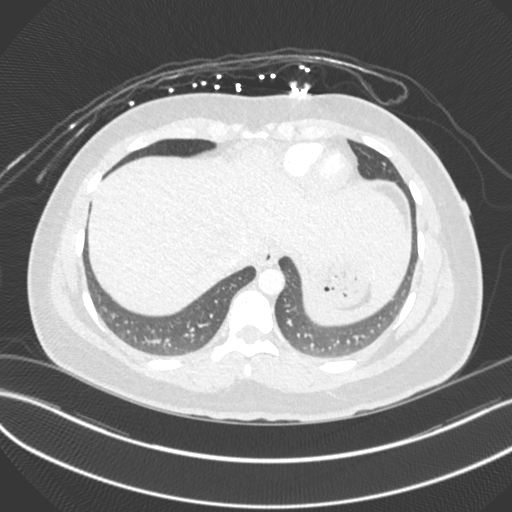
[im 107/386  soft-tissue]
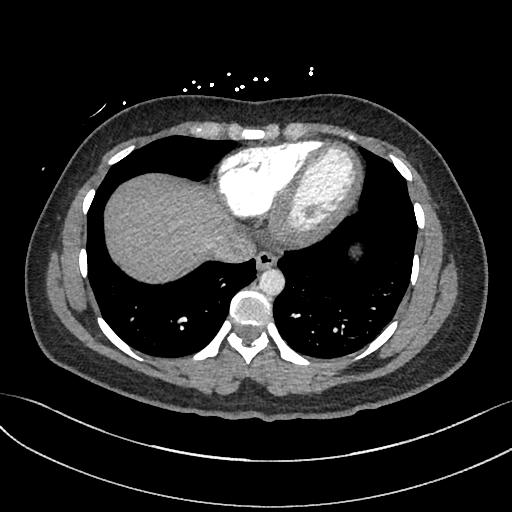
[im 129/386  lung]
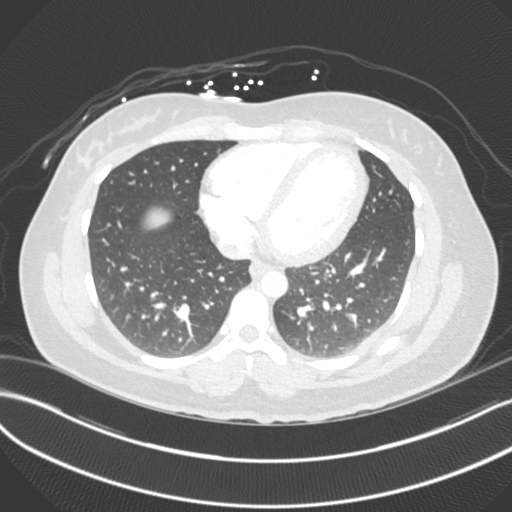
[im 172/386  soft-tissue]
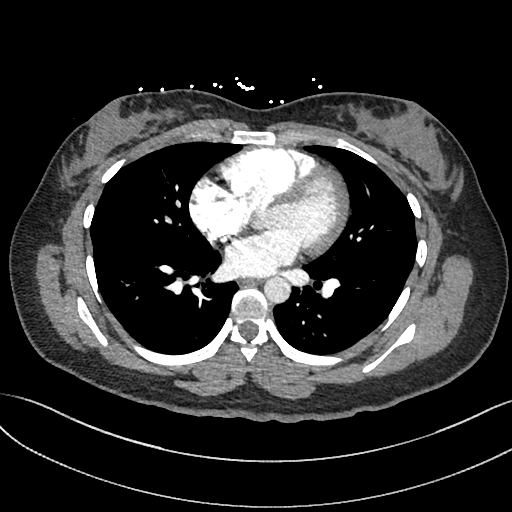
[im 193/386  lung]
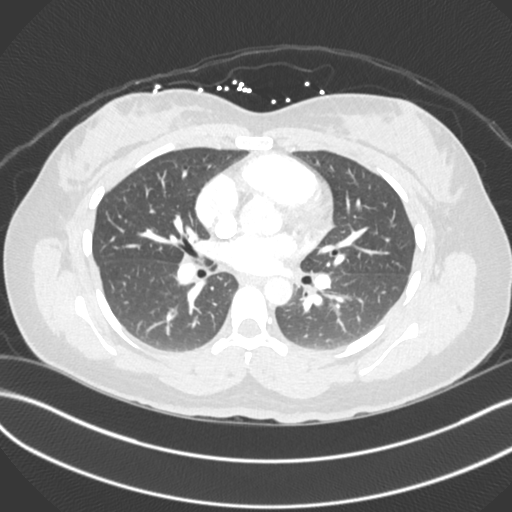
[im 214/386  soft-tissue]
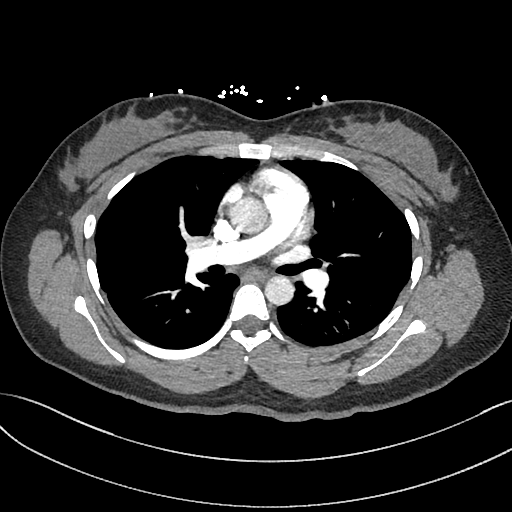
[im 257/386  lung]
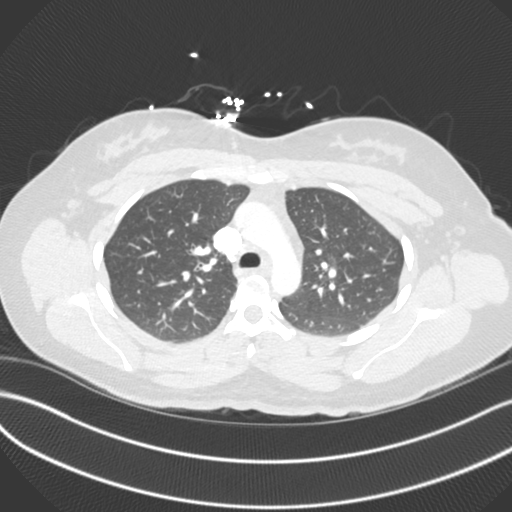
[im 279/386  soft-tissue]
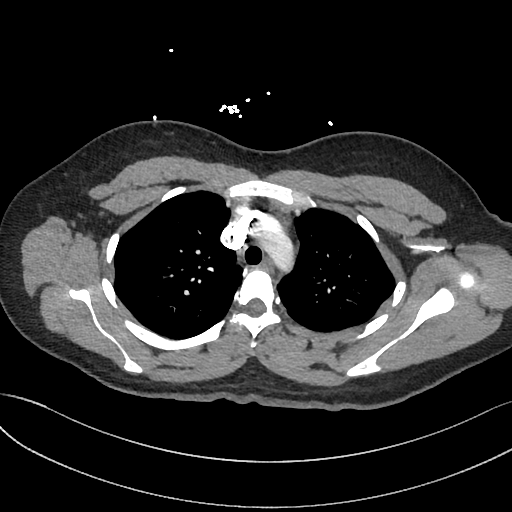
[im 300/386  lung]
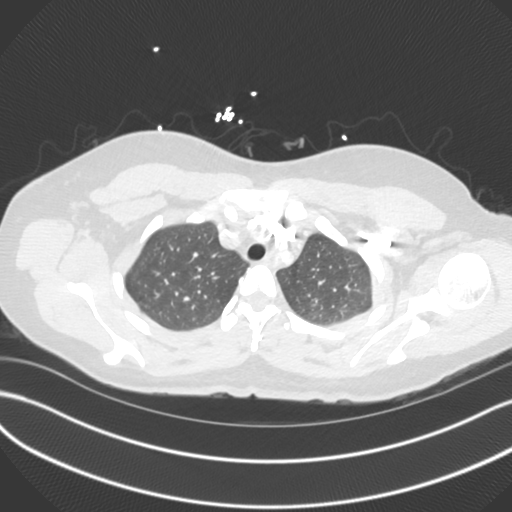
[im 343/386  soft-tissue]
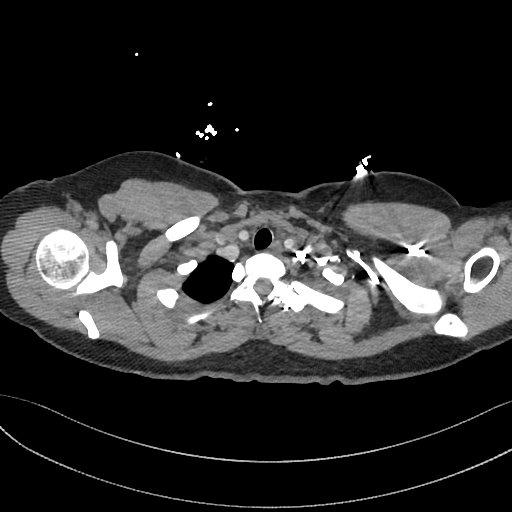
[im 364/386  lung]
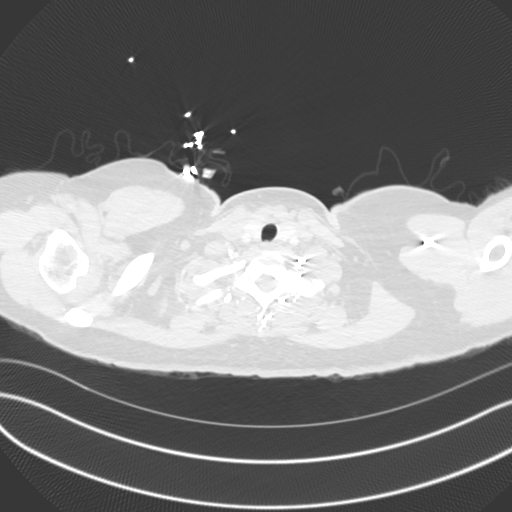

[Series 6: cor · coronal · 0.56mm/px · 3 of 141 slices shown]
[im 36/141  soft-tissue]
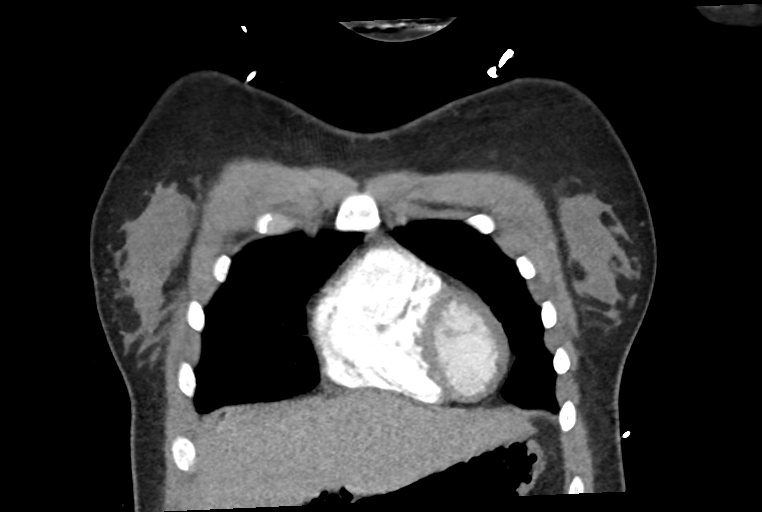
[im 71/141  soft-tissue]
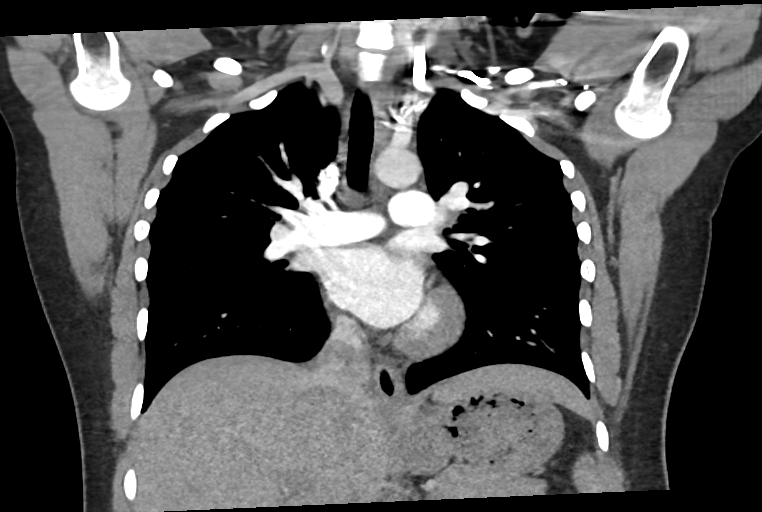
[im 106/141  soft-tissue]
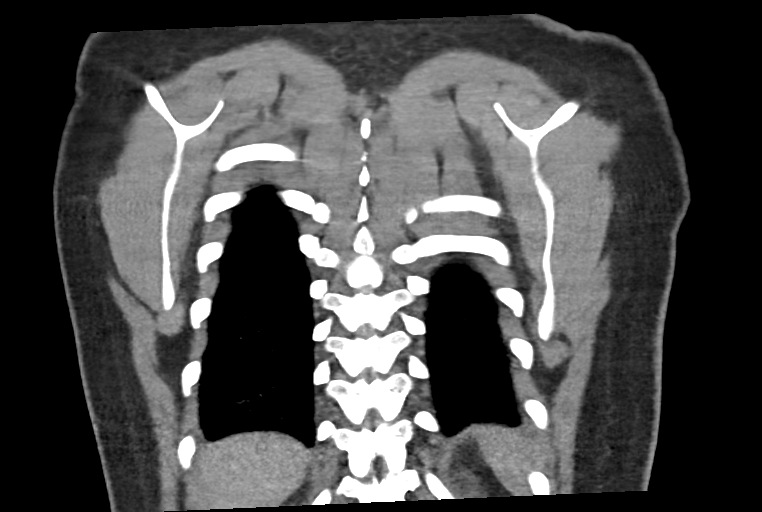

[16 of 46 positions shown; findings below may reference images not displayed]

RADIATION DOSE REDUCTION: This exam was performed according to the
departmental dose-optimization program which includes automated
exposure control, adjustment of the mA and/or kV according to
patient size and/or use of iterative reconstruction technique.

CONTRAST:  100mL OMNIPAQUE IOHEXOL 350 MG/ML SOLN
FINDINGS: CTA CHEST FINDINGS

Cardiovascular: Satisfactory opacification of the pulmonary arteries
to the segmental level. No evidence of pulmonary embolism. Normal
heart size. No pericardial effusion.

Mediastinum/Nodes: No enlarged mediastinal, hilar, or axillary lymph
nodes. Thyroid gland, trachea, and esophagus demonstrate no
significant findings.

Lungs/Pleura: Lungs are clear.  No pleural effusion or pneumothorax.

Musculoskeletal: No abdominal wall hernia identified. Small amount
of abdominal wall air is compatible with recent surgery.

Review of the MIP images confirms the above findings.

CT ABDOMEN and PELVIS FINDINGS

Hepatobiliary: No focal liver abnormality is seen. No gallstones,
gallbladder wall thickening, or biliary dilatation.

Pancreas: Unremarkable. No pancreatic ductal dilatation or
surrounding inflammatory changes.

Spleen: Normal in size without focal abnormality.

Adrenals/Urinary Tract: Adrenal glands are unremarkable. Kidneys are
normal, without renal calculi, focal lesion, or hydronephrosis.
Bladder is unremarkable.

Stomach/Bowel: Stomach is within normal limits. Appendix appears
normal. No evidence of bowel wall thickening, distention, or
inflammatory changes.

Vascular/Lymphatic: No significant vascular findings are present. No
enlarged abdominal or pelvic lymph nodes.

Reproductive: Uterus and bilateral adnexa are unremarkable.

Other: There is a small amount of free fluid in the pelvis. Small
area of hyperdensity may represent hematoma in the right adnexa
measuring 1.5 x 1.1 cm. There is a small amount of free air anomaly
in the pelvis, but also minimally in the upper abdomen compatible
with recent surgery.

Musculoskeletal: No acute or significant osseous findings.

Review of the MIP images confirms the above findings.
IMPRESSION: 1. No evidence for pulmonary embolism. No acute cardiopulmonary
process.
2. Small amount of free intraperitoneal air and air within the
abdominal wall compatible with patient's history of recent surgery.
3. Small amount of free fluid in the pelvis with likely small pelvic
hematoma in the right adnexa.

## 2024-01-30 ENCOUNTER — Ambulatory Visit: Payer: BC Managed Care – PPO | Admitting: Family Medicine

## 2024-01-31 ENCOUNTER — Ambulatory Visit: Payer: BC Managed Care – PPO

## 2024-02-04 ENCOUNTER — Other Ambulatory Visit (HOSPITAL_COMMUNITY)
Admission: RE | Admit: 2024-02-04 | Discharge: 2024-02-04 | Disposition: A | Discharge status: Home / Self Care | Payer: BC Managed Care – PPO | Source: Ambulatory Visit | Attending: Obstetrics and Gynecology | Admitting: Obstetrics and Gynecology

## 2024-02-04 ENCOUNTER — Ambulatory Visit: Payer: BC Managed Care – PPO | Admitting: General Practice

## 2024-02-04 VITALS — BP 105/72 | HR 75 | Ht 65.0 in | Wt 171.0 lb

## 2024-02-04 DIAGNOSIS — N898 Other specified noninflammatory disorders of vagina: Secondary | ICD-10-CM

## 2024-02-04 NOTE — Progress Notes (Signed)
 SUBJECTIVE:  23 y.o. female complains of malodorous vaginal discharge for 2 week(s). Denies abnormal vaginal bleeding or significant pelvic pain or fever. No UTI symptoms. Denies history of known exposure to STD.  No LMP recorded.  OBJECTIVE:  She appears well, afebrile. Urine dipstick: not done.  ASSESSMENT:  Vaginal Discharge  Vaginal Odor   PLAN:  GC, chlamydia, trichomonas, BVAG, CVAG probe sent to lab. Treatment: To be determined once lab results are received ROV prn if symptoms persist or worsen.   Pt c/o irregular bleeding with BC patch. Pt advised to schedule appt with provider for irregular bleeding with [facility]

## 2024-02-06 LAB — CERVICOVAGINAL ANCILLARY ONLY
Bacterial Vaginitis (gardnerella): POSITIVE — AB
Candida Glabrata: NEGATIVE
Candida Vaginitis: NEGATIVE
Chlamydia: NEGATIVE
Comment: NEGATIVE
Comment: NEGATIVE
Comment: NEGATIVE
Comment: NEGATIVE
Comment: NEGATIVE
Comment: NORMAL
Neisseria Gonorrhea: NEGATIVE
Trichomonas: NEGATIVE

## 2024-02-07 ENCOUNTER — Encounter: Payer: Self-pay | Admitting: Obstetrics and Gynecology

## 2024-02-07 MED ORDER — METRONIDAZOLE 500 MG PO TABS
500.0000 mg | ORAL_TABLET | Freq: Two times a day (BID) | ORAL | 0 refills | Status: DC
Start: 2024-02-07 — End: 2024-04-10

## 2024-02-07 NOTE — Addendum Note (Signed)
 Addended by: Catalina Antigua on: 02/07/2024 12:31 PM   Modules accepted: Orders

## 2024-02-15 ENCOUNTER — Ambulatory Visit: Payer: BC Managed Care – PPO | Admitting: Obstetrics & Gynecology

## 2024-04-10 ENCOUNTER — Ambulatory Visit (HOSPITAL_COMMUNITY)
Admission: EM | Admit: 2024-04-10 | Discharge: 2024-04-10 | Disposition: A | Discharge status: Home / Self Care | Attending: Family Medicine | Admitting: Family Medicine

## 2024-04-10 ENCOUNTER — Other Ambulatory Visit: Payer: Self-pay

## 2024-04-10 ENCOUNTER — Encounter (HOSPITAL_COMMUNITY): Payer: Self-pay | Admitting: Emergency Medicine

## 2024-04-10 DIAGNOSIS — R070 Pain in throat: Secondary | ICD-10-CM | POA: Diagnosis present

## 2024-04-10 DIAGNOSIS — R0789 Other chest pain: Secondary | ICD-10-CM | POA: Insufficient documentation

## 2024-04-10 LAB — POCT RAPID STREP A (OFFICE): Rapid Strep A Screen: NEGATIVE

## 2024-04-10 MED ORDER — IBUPROFEN 600 MG PO TABS: 600.0000 mg | ORAL_TABLET | Freq: Three times a day (TID) | ORAL | 0 refills | Status: AC | PRN

## 2024-04-10 MED ORDER — ONDANSETRON 4 MG PO TBDP: 4.0000 mg | ORAL_TABLET | Freq: Three times a day (TID) | ORAL | 0 refills | Status: AC | PRN

## 2024-04-10 NOTE — ED Triage Notes (Addendum)
 For one week has had chest and back pain.  No known injury.  This is not the first episode of pain.  Patient has taken tylenol .    Patient relates pain to standing at job for 13 hours.   Has used a cream to muscles of chest and back and had minimal relief.  Nurse at work reports patient's throat is swollen and needed to come and get it checked.  Patient says throat is slightly sore.    MOVING ARMS OR SHRUGGING OF SHOULDERS MAKES BACK AND CHEST PAIN OCCUR

## 2024-04-10 NOTE — ED Provider Notes (Addendum)
 [facility]    CSN: [id number] Arrival date & time: 04/10/24  1615      History   Chief Complaint Chief Complaint  Patient presents with   Back Pain    HPI Meredith Sullivan is a 23 y.o. female.    Back Pain Here for sharp anterior chest pain and upper back pain that began this morning.  She is also felt tired and a little dizzy.  Her throat is a little sore.  No cough or nasal congestion or rhinorrhea.  No fever noted.  No vomiting or diarrhea.  She has maybe had a little nausea off-and-on.   NKDA  Last menstrual cycle was April 26.   Past Medical History:  Diagnosis Date   Medical history non-contributory    Right tubal pregnancy without intrauterine pregnancy     There are no active problems to display for this patient.   Past Surgical History:  Procedure Laterality Date   LAPAROSCOPIC UNILATERAL SALPINGECTOMY Right 03/20/2022   Procedure: LAPAROSCOPIC RIGHT SALPINGECTOMY WITH REMOVAL OF ECTOPIC PREGNANCY;  Surgeon: Verlyn Goad, MD;  Location: [facility] OR;  Service: Gynecology;  Laterality: Right;   NO PAST SURGERIES      OB History     Gravida  2   Para      Term      Preterm      AB  2   Living         SAB      IAB  2   Ectopic      Multiple      Live Births               Home Medications    Prior to Admission medications   Medication Sig Start Date End Date Taking? Authorizing Provider  ibuprofen  (ADVIL ) 600 MG tablet Take 1 tablet (600 mg total) by mouth every 8 (eight) hours as needed (pain). 04/10/24  Yes Ann Keto, MD  ondansetron  (ZOFRAN -ODT) 4 MG disintegrating tablet Take 1 tablet (4 mg total) by mouth every 8 (eight) hours as needed for nausea or vomiting. 04/10/24  Yes Demaria Deeney K, MD  norelgestromin -ethinyl estradiol  (XULANE ) 150-35 MCG/24HR transdermal patch Place 1 patch onto the skin once a week. Patient taking differently: Place 1 patch onto the skin once a week. NONCOMPLIANT WITH PATCHES  07/16/23   Izell Marsh, MD    Family History Family History  Problem Relation Age of Onset   Healthy Mother    Healthy Father     Social History Social History   Tobacco Use   Smoking status: Never   Smokeless tobacco: Never  Vaping Use   Vaping status: Some Days   Substances: Flavoring  Substance Use Topics   Alcohol use: Yes   Drug use: Not Currently    Types: Marijuana    Comment: Last used this past Saturday 03/20/2022     Allergies   Patient has no known allergies.   Review of Systems Review of Systems  Musculoskeletal:  Positive for back pain.     Physical Exam Triage Vital Signs ED Triage Vitals  Encounter Vitals Group     BP 04/10/24 1731 (!) 96/58     Systolic BP Percentile --      Diastolic BP Percentile --      Pulse Rate 04/10/24 1731 63     Resp 04/10/24 1731 18     Temp 04/10/24 1731 98.1 F (36.7 C)     Temp Source  04/10/24 1731 Oral     SpO2 04/10/24 1731 98 %     Weight --      Height --      Head Circumference --      Peak Flow --      Pain Score 04/10/24 1728 8     Pain Loc --      Pain Education --      Exclude from Growth Chart --    No data found.  Updated Vital Signs BP (!) 96/58 (BP Location: Right Arm)   Pulse 63   Temp 98.1 F (36.7 C) (Oral)   Resp 18   LMP 04/05/2024   SpO2 98%   Visual Acuity Right Eye Distance:   Left Eye Distance:   Bilateral Distance:    Right Eye Near:   Left Eye Near:    Bilateral Near:     Physical Exam Vitals reviewed.  Constitutional:      General: She is not in acute distress.    Appearance: She is not toxic-appearing.  HENT:     Right Ear: Tympanic membrane and ear canal normal.     Left Ear: Tympanic membrane and ear canal normal.     Nose: Nose normal.     Mouth/Throat:     Mouth: Mucous membranes are moist.     Comments: There is 1+ hypertrophy of the tonsils and there is erythema of the tonsils and the posterior oropharynx.  Uvula is in the midline and there is no  asymmetry.  There is some clear exudate in the posterior oropharynx also.` Eyes:     Extraocular Movements: Extraocular movements intact.     Conjunctiva/sclera: Conjunctivae normal.     Pupils: Pupils are equal, round, and reactive to light.  Cardiovascular:     Rate and Rhythm: Normal rate and regular rhythm.     Heart sounds: No murmur heard. Pulmonary:     Effort: Pulmonary effort is normal. No respiratory distress.     Breath sounds: No wheezing, rhonchi or rales.  Chest:     Chest wall: No tenderness.  Musculoskeletal:     Cervical back: Neck supple.  Lymphadenopathy:     Cervical: No cervical adenopathy.  Skin:    Capillary Refill: Capillary refill takes less than 2 seconds.     Coloration: Skin is not jaundiced or pale.  Neurological:     General: No focal deficit present.     Mental Status: She is alert and oriented to person, place, and time.  Psychiatric:        Behavior: Behavior normal.      UC Treatments / Results  Labs (all labs ordered are listed, but only abnormal results are displayed) Labs Reviewed  POCT RAPID STREP A (OFFICE) - Normal  CULTURE, GROUP A STREP ([facility])  SARS CORONAVIRUS 2 (TAT 6-24 HRS)    EKG   Radiology No results found.  Procedures Procedures (including critical care time)  Medications Ordered in UC Medications - No data to display  Initial Impression / Assessment and Plan / UC Course  I have reviewed the triage vital signs and the nursing notes.  Pertinent labs & imaging results that were available during my care of the patient were reviewed by me and considered in my medical decision making (see chart for details).     Rapid strep is negative.  Throat culture is sent and we will notify and treat protocol if that is positive  Ibuprofen  is sent in for the pain.  PCR COVID test is done and staff will notify her if she is positive so she will know she needs to quarantine.  Zofran  as needed for the nausea Final Clinical  Impressions(s) / UC Diagnoses   Final diagnoses:  Throat pain  Chest wall pain     Discharge Instructions      Your strep test is negative.  Culture of the throat will be sent, and staff will notify you if that is in turn positive.  Take ibuprofen  600 mg--1 tab every 8 hours as needed for pain.  You have been swabbed for COVID, and the test will result in the next 24 hours. Our staff will call you if positive. If the COVID test is positive, you should quarantine until you are fever free for 24 hours and you are starting to feel better, and then take added precautions for the next 5 days, such as physical distancing/wearing a mask and good hand hygiene/washing.  (Su prueba de estreptococos es negativa. Se le realizar un cultivo de garganta y, si resulta positivo, Engineering geologist.  Tome ibuprofeno 600 mg (1 comprimido cada 8 horas) segn sea necesario para el dolor.  Se le ha realizado una prueba de COVID-19 y el resultado se dar en las prximas 24 horas. Nuestro personal le llamar si da positivo. Si la prueba de COVID-19 es positiva, deber Personal assistant en cuarentena hasta que no tenga fiebre durante 24 horas y empiece a Actor. Posteriormente, deber tomar precauciones [employer] prximos [address]  [address], como mantener el distanciamiento fsico, usar IT consultant y Pharmacologist una buena higiene de [place].)      ED Prescriptions     Medication Sig Dispense Auth. Provider   ibuprofen  (ADVIL ) 600 MG tablet Take 1 tablet (600 mg total) by mouth every 8 (eight) hours as needed (pain). 15 tablet Radonna Bracher, Paige Boatman, MD   ondansetron  (ZOFRAN -ODT) 4 MG disintegrating tablet Take 1 tablet (4 mg total) by mouth every 8 (eight) hours as needed for nausea or vomiting. 10 tablet Ellsworth Haas Zakye Baby K, MD      PDMP not reviewed this encounter.   Ann Keto, MD 04/10/24 1827    Ann Keto, MD 04/10/24 [phone]

## 2024-04-10 NOTE — Discharge Instructions (Addendum)
 Your strep test is negative.  Culture of the throat will be sent, and staff will notify you if that is in turn positive.  Take ibuprofen  600 mg--1 tab every 8 hours as needed for pain.  You have been swabbed for COVID, and the test will result in the next 24 hours. Our staff will call you if positive. If the COVID test is positive, you should quarantine until you are fever free for 24 hours and you are starting to feel better, and then take added precautions for the next 5 days, such as physical distancing/wearing a mask and good hand hygiene/washing.  (Su prueba de estreptococos es negativa. Se le realizar un cultivo de garganta y, si resulta positivo, Engineering geologist.  Tome ibuprofeno 600 mg (1 comprimido cada 8 horas) segn sea necesario para el dolor.  Se le ha realizado una prueba de COVID-19 y el resultado se dar en las prximas 24 horas. Nuestro personal le llamar si da positivo. Si la prueba de COVID-19 es positiva, deber Personal assistant en cuarentena hasta que no tenga fiebre durante 24 horas y empiece a Actor. Posteriormente, deber tomar precauciones [employer] prximos [address]  [address], como mantener el distanciamiento fsico, usar IT consultant y Pharmacologist una buena higiene de [place].)

## 2024-04-12 ENCOUNTER — Telehealth (HOSPITAL_COMMUNITY): Payer: Self-pay | Admitting: Radiology

## 2024-04-12 NOTE — Telephone Encounter (Signed)
 [facility] called stating that pt's covid swab "failed" the first time it was ran, then "failed when attempting to qc it. did not have enough to test it a third time". lab states they have to cancel the order.

## 2024-04-13 LAB — CULTURE, GROUP A STREP (THRC)
